# Patient Record
Sex: Male | Born: 1945
Health system: Southern US, Community
[De-identification: ages and names within clinical notes are randomized; demographics above are authoritative.]

## PROBLEM LIST (undated history)

## (undated) DIAGNOSIS — K219 Gastro-esophageal reflux disease without esophagitis: Secondary | ICD-10-CM

## (undated) DIAGNOSIS — M1990 Unspecified osteoarthritis, unspecified site: Secondary | ICD-10-CM

## (undated) DIAGNOSIS — I219 Acute myocardial infarction, unspecified: Secondary | ICD-10-CM

## (undated) DIAGNOSIS — G8929 Other chronic pain: Secondary | ICD-10-CM

## (undated) DIAGNOSIS — E119 Type 2 diabetes mellitus without complications: Secondary | ICD-10-CM

## (undated) DIAGNOSIS — R0600 Dyspnea, unspecified: Secondary | ICD-10-CM

## (undated) DIAGNOSIS — M549 Dorsalgia, unspecified: Secondary | ICD-10-CM

## (undated) DIAGNOSIS — J449 Chronic obstructive pulmonary disease, unspecified: Secondary | ICD-10-CM

## (undated) DIAGNOSIS — I8290 Acute embolism and thrombosis of unspecified vein: Secondary | ICD-10-CM

## (undated) DIAGNOSIS — I10 Essential (primary) hypertension: Secondary | ICD-10-CM

## (undated) DIAGNOSIS — E785 Hyperlipidemia, unspecified: Secondary | ICD-10-CM

## (undated) DIAGNOSIS — I493 Ventricular premature depolarization: Secondary | ICD-10-CM

## (undated) DIAGNOSIS — I499 Cardiac arrhythmia, unspecified: Secondary | ICD-10-CM

## (undated) DIAGNOSIS — G629 Polyneuropathy, unspecified: Secondary | ICD-10-CM

## (undated) HISTORY — PX: THROMBECTOMY: SHX45

## (undated) HISTORY — DX: Dorsalgia, unspecified: M54.9

## (undated) HISTORY — DX: Acute myocardial infarction, unspecified: I21.9

## (undated) HISTORY — DX: Type 2 diabetes mellitus without complications: E11.9

## (undated) HISTORY — PX: CARDIAC CATHETERIZATION: SHX172

## (undated) HISTORY — DX: Hyperlipidemia, unspecified: E78.5

## (undated) HISTORY — PX: OTHER SURGICAL HISTORY: SHX169

## (undated) HISTORY — DX: Essential (primary) hypertension: I10

## (undated) HISTORY — DX: Polyneuropathy, unspecified: G62.9

## (undated) HISTORY — DX: Other chronic pain: G89.29

## (undated) HISTORY — PX: APPENDECTOMY: SHX54

---

## 1995-02-25 HISTORY — PX: BACK SURGERY: SHX140

## 1997-02-24 HISTORY — PX: INGUINAL HERNIA REPAIR: SUR1180

## 2006-10-27 ENCOUNTER — Other Ambulatory Visit: Payer: Self-pay

## 2006-10-27 ENCOUNTER — Emergency Department: Payer: Self-pay | Admitting: Emergency Medicine

## 2007-10-25 ENCOUNTER — Ambulatory Visit: Payer: Self-pay | Admitting: Pain Medicine

## 2007-12-06 ENCOUNTER — Ambulatory Visit: Payer: Self-pay | Admitting: Pain Medicine

## 2008-01-31 ENCOUNTER — Ambulatory Visit: Payer: Self-pay | Admitting: General Surgery

## 2008-02-08 ENCOUNTER — Ambulatory Visit: Payer: Self-pay | Admitting: General Surgery

## 2008-02-08 HISTORY — PX: HERNIA REPAIR: SHX51

## 2008-03-20 ENCOUNTER — Ambulatory Visit: Payer: Self-pay

## 2008-06-05 ENCOUNTER — Ambulatory Visit: Payer: Self-pay | Admitting: Pain Medicine

## 2008-08-31 ENCOUNTER — Ambulatory Visit: Payer: Self-pay | Admitting: Physician Assistant

## 2008-12-05 ENCOUNTER — Ambulatory Visit: Payer: Self-pay | Admitting: Physician Assistant

## 2009-02-26 ENCOUNTER — Ambulatory Visit: Payer: Self-pay | Admitting: Urology

## 2009-03-09 ENCOUNTER — Ambulatory Visit: Payer: Self-pay | Admitting: Physician Assistant

## 2009-06-04 ENCOUNTER — Ambulatory Visit: Payer: Self-pay | Admitting: Pain Medicine

## 2009-06-22 ENCOUNTER — Ambulatory Visit: Payer: Self-pay | Admitting: Physician Assistant

## 2011-06-02 ENCOUNTER — Ambulatory Visit: Payer: Self-pay | Admitting: Pain Medicine

## 2011-06-16 ENCOUNTER — Ambulatory Visit: Payer: Self-pay | Admitting: Pain Medicine

## 2011-06-26 ENCOUNTER — Ambulatory Visit: Payer: Self-pay | Admitting: Pain Medicine

## 2011-06-27 ENCOUNTER — Ambulatory Visit: Payer: Self-pay | Admitting: Pain Medicine

## 2011-07-15 ENCOUNTER — Ambulatory Visit: Payer: Self-pay | Admitting: Pain Medicine

## 2011-07-16 ENCOUNTER — Ambulatory Visit: Payer: Self-pay | Admitting: Pain Medicine

## 2011-07-24 ENCOUNTER — Ambulatory Visit: Payer: Self-pay | Admitting: Pain Medicine

## 2013-11-12 DIAGNOSIS — J449 Chronic obstructive pulmonary disease, unspecified: Secondary | ICD-10-CM | POA: Insufficient documentation

## 2013-11-12 DIAGNOSIS — I10 Essential (primary) hypertension: Secondary | ICD-10-CM | POA: Insufficient documentation

## 2013-11-12 DIAGNOSIS — E119 Type 2 diabetes mellitus without complications: Secondary | ICD-10-CM | POA: Insufficient documentation

## 2013-11-12 DIAGNOSIS — I2510 Atherosclerotic heart disease of native coronary artery without angina pectoris: Secondary | ICD-10-CM | POA: Insufficient documentation

## 2013-12-19 ENCOUNTER — Ambulatory Visit: Payer: Self-pay | Admitting: Gastroenterology

## 2014-03-28 DIAGNOSIS — I25118 Atherosclerotic heart disease of native coronary artery with other forms of angina pectoris: Secondary | ICD-10-CM | POA: Diagnosis not present

## 2014-03-28 DIAGNOSIS — E119 Type 2 diabetes mellitus without complications: Secondary | ICD-10-CM | POA: Diagnosis not present

## 2014-04-04 DIAGNOSIS — E119 Type 2 diabetes mellitus without complications: Secondary | ICD-10-CM | POA: Diagnosis not present

## 2014-04-04 DIAGNOSIS — H9313 Tinnitus, bilateral: Secondary | ICD-10-CM | POA: Diagnosis not present

## 2014-04-04 DIAGNOSIS — J449 Chronic obstructive pulmonary disease, unspecified: Secondary | ICD-10-CM | POA: Diagnosis not present

## 2014-04-04 DIAGNOSIS — I25118 Atherosclerotic heart disease of native coronary artery with other forms of angina pectoris: Secondary | ICD-10-CM | POA: Diagnosis not present

## 2014-04-04 DIAGNOSIS — M5431 Sciatica, right side: Secondary | ICD-10-CM | POA: Insufficient documentation

## 2014-06-01 DIAGNOSIS — H524 Presbyopia: Secondary | ICD-10-CM | POA: Diagnosis not present

## 2014-06-01 DIAGNOSIS — H2513 Age-related nuclear cataract, bilateral: Secondary | ICD-10-CM | POA: Diagnosis not present

## 2014-06-01 DIAGNOSIS — E119 Type 2 diabetes mellitus without complications: Secondary | ICD-10-CM | POA: Diagnosis not present

## 2014-07-26 DIAGNOSIS — I25118 Atherosclerotic heart disease of native coronary artery with other forms of angina pectoris: Secondary | ICD-10-CM | POA: Diagnosis not present

## 2014-07-26 DIAGNOSIS — I10 Essential (primary) hypertension: Secondary | ICD-10-CM | POA: Diagnosis not present

## 2014-07-26 DIAGNOSIS — E119 Type 2 diabetes mellitus without complications: Secondary | ICD-10-CM | POA: Diagnosis not present

## 2014-08-01 DIAGNOSIS — I10 Essential (primary) hypertension: Secondary | ICD-10-CM | POA: Diagnosis not present

## 2014-08-01 DIAGNOSIS — J449 Chronic obstructive pulmonary disease, unspecified: Secondary | ICD-10-CM | POA: Diagnosis not present

## 2014-08-01 DIAGNOSIS — I25118 Atherosclerotic heart disease of native coronary artery with other forms of angina pectoris: Secondary | ICD-10-CM | POA: Diagnosis not present

## 2014-08-01 DIAGNOSIS — E119 Type 2 diabetes mellitus without complications: Secondary | ICD-10-CM | POA: Diagnosis not present

## 2014-10-10 DIAGNOSIS — I214 Non-ST elevation (NSTEMI) myocardial infarction: Secondary | ICD-10-CM | POA: Diagnosis not present

## 2014-10-10 DIAGNOSIS — R55 Syncope and collapse: Secondary | ICD-10-CM | POA: Insufficient documentation

## 2014-10-10 DIAGNOSIS — R079 Chest pain, unspecified: Secondary | ICD-10-CM | POA: Diagnosis not present

## 2014-10-10 DIAGNOSIS — I10 Essential (primary) hypertension: Secondary | ICD-10-CM | POA: Diagnosis not present

## 2014-10-10 DIAGNOSIS — R0602 Shortness of breath: Secondary | ICD-10-CM | POA: Diagnosis not present

## 2014-10-10 DIAGNOSIS — I25118 Atherosclerotic heart disease of native coronary artery with other forms of angina pectoris: Secondary | ICD-10-CM | POA: Diagnosis not present

## 2014-10-13 DIAGNOSIS — I493 Ventricular premature depolarization: Secondary | ICD-10-CM | POA: Diagnosis not present

## 2014-10-20 DIAGNOSIS — R079 Chest pain, unspecified: Secondary | ICD-10-CM | POA: Diagnosis not present

## 2014-10-20 DIAGNOSIS — R0602 Shortness of breath: Secondary | ICD-10-CM | POA: Diagnosis not present

## 2014-10-26 DIAGNOSIS — E119 Type 2 diabetes mellitus without complications: Secondary | ICD-10-CM | POA: Diagnosis not present

## 2014-10-26 DIAGNOSIS — I25118 Atherosclerotic heart disease of native coronary artery with other forms of angina pectoris: Secondary | ICD-10-CM | POA: Diagnosis not present

## 2014-10-27 DIAGNOSIS — I25118 Atherosclerotic heart disease of native coronary artery with other forms of angina pectoris: Secondary | ICD-10-CM | POA: Diagnosis not present

## 2014-10-27 DIAGNOSIS — R55 Syncope and collapse: Secondary | ICD-10-CM | POA: Diagnosis not present

## 2014-10-27 DIAGNOSIS — I10 Essential (primary) hypertension: Secondary | ICD-10-CM | POA: Diagnosis not present

## 2014-10-27 DIAGNOSIS — I214 Non-ST elevation (NSTEMI) myocardial infarction: Secondary | ICD-10-CM | POA: Diagnosis not present

## 2014-11-02 DIAGNOSIS — J449 Chronic obstructive pulmonary disease, unspecified: Secondary | ICD-10-CM | POA: Diagnosis not present

## 2014-11-02 DIAGNOSIS — R51 Headache: Secondary | ICD-10-CM | POA: Diagnosis not present

## 2014-11-02 DIAGNOSIS — I10 Essential (primary) hypertension: Secondary | ICD-10-CM | POA: Diagnosis not present

## 2014-11-02 DIAGNOSIS — I25118 Atherosclerotic heart disease of native coronary artery with other forms of angina pectoris: Secondary | ICD-10-CM | POA: Diagnosis not present

## 2014-11-02 DIAGNOSIS — E119 Type 2 diabetes mellitus without complications: Secondary | ICD-10-CM | POA: Diagnosis not present

## 2014-11-27 DIAGNOSIS — Z23 Encounter for immunization: Secondary | ICD-10-CM | POA: Diagnosis not present

## 2014-11-27 DIAGNOSIS — J301 Allergic rhinitis due to pollen: Secondary | ICD-10-CM | POA: Diagnosis not present

## 2015-01-26 DIAGNOSIS — I25118 Atherosclerotic heart disease of native coronary artery with other forms of angina pectoris: Secondary | ICD-10-CM | POA: Diagnosis not present

## 2015-01-26 DIAGNOSIS — R079 Chest pain, unspecified: Secondary | ICD-10-CM | POA: Diagnosis not present

## 2015-01-26 DIAGNOSIS — I10 Essential (primary) hypertension: Secondary | ICD-10-CM | POA: Diagnosis not present

## 2015-01-26 DIAGNOSIS — J449 Chronic obstructive pulmonary disease, unspecified: Secondary | ICD-10-CM | POA: Diagnosis not present

## 2015-01-26 DIAGNOSIS — R0602 Shortness of breath: Secondary | ICD-10-CM | POA: Diagnosis not present

## 2015-01-26 DIAGNOSIS — R55 Syncope and collapse: Secondary | ICD-10-CM | POA: Diagnosis not present

## 2015-01-26 DIAGNOSIS — E119 Type 2 diabetes mellitus without complications: Secondary | ICD-10-CM | POA: Diagnosis not present

## 2015-01-26 DIAGNOSIS — I214 Non-ST elevation (NSTEMI) myocardial infarction: Secondary | ICD-10-CM | POA: Diagnosis not present

## 2015-02-01 DIAGNOSIS — I25118 Atherosclerotic heart disease of native coronary artery with other forms of angina pectoris: Secondary | ICD-10-CM | POA: Diagnosis not present

## 2015-02-01 DIAGNOSIS — J449 Chronic obstructive pulmonary disease, unspecified: Secondary | ICD-10-CM | POA: Diagnosis not present

## 2015-02-01 DIAGNOSIS — I10 Essential (primary) hypertension: Secondary | ICD-10-CM | POA: Diagnosis not present

## 2015-02-01 DIAGNOSIS — E119 Type 2 diabetes mellitus without complications: Secondary | ICD-10-CM | POA: Diagnosis not present

## 2015-02-25 HISTORY — PX: COLONOSCOPY: SHX174

## 2015-05-31 DIAGNOSIS — E119 Type 2 diabetes mellitus without complications: Secondary | ICD-10-CM | POA: Diagnosis not present

## 2015-05-31 DIAGNOSIS — I25118 Atherosclerotic heart disease of native coronary artery with other forms of angina pectoris: Secondary | ICD-10-CM | POA: Diagnosis not present

## 2015-06-07 DIAGNOSIS — E119 Type 2 diabetes mellitus without complications: Secondary | ICD-10-CM | POA: Diagnosis not present

## 2015-06-07 DIAGNOSIS — Z0000 Encounter for general adult medical examination without abnormal findings: Secondary | ICD-10-CM | POA: Insufficient documentation

## 2015-06-07 DIAGNOSIS — I25118 Atherosclerotic heart disease of native coronary artery with other forms of angina pectoris: Secondary | ICD-10-CM | POA: Diagnosis not present

## 2015-06-07 DIAGNOSIS — Z0001 Encounter for general adult medical examination with abnormal findings: Secondary | ICD-10-CM | POA: Diagnosis not present

## 2015-06-07 DIAGNOSIS — I10 Essential (primary) hypertension: Secondary | ICD-10-CM | POA: Diagnosis not present

## 2015-06-07 DIAGNOSIS — J449 Chronic obstructive pulmonary disease, unspecified: Secondary | ICD-10-CM | POA: Diagnosis not present

## 2015-08-02 DIAGNOSIS — I10 Essential (primary) hypertension: Secondary | ICD-10-CM | POA: Diagnosis not present

## 2015-08-02 DIAGNOSIS — R079 Chest pain, unspecified: Secondary | ICD-10-CM | POA: Diagnosis not present

## 2015-08-02 DIAGNOSIS — R55 Syncope and collapse: Secondary | ICD-10-CM | POA: Diagnosis not present

## 2015-08-02 DIAGNOSIS — J449 Chronic obstructive pulmonary disease, unspecified: Secondary | ICD-10-CM | POA: Diagnosis not present

## 2015-08-02 DIAGNOSIS — R0602 Shortness of breath: Secondary | ICD-10-CM | POA: Diagnosis not present

## 2015-08-02 DIAGNOSIS — I2510 Atherosclerotic heart disease of native coronary artery without angina pectoris: Secondary | ICD-10-CM | POA: Diagnosis not present

## 2015-10-01 DIAGNOSIS — I25118 Atherosclerotic heart disease of native coronary artery with other forms of angina pectoris: Secondary | ICD-10-CM | POA: Diagnosis not present

## 2015-10-01 DIAGNOSIS — E119 Type 2 diabetes mellitus without complications: Secondary | ICD-10-CM | POA: Diagnosis not present

## 2015-10-08 DIAGNOSIS — I10 Essential (primary) hypertension: Secondary | ICD-10-CM | POA: Diagnosis not present

## 2015-10-08 DIAGNOSIS — J449 Chronic obstructive pulmonary disease, unspecified: Secondary | ICD-10-CM | POA: Diagnosis not present

## 2015-10-08 DIAGNOSIS — E119 Type 2 diabetes mellitus without complications: Secondary | ICD-10-CM | POA: Diagnosis not present

## 2015-10-08 DIAGNOSIS — I2510 Atherosclerotic heart disease of native coronary artery without angina pectoris: Secondary | ICD-10-CM | POA: Diagnosis not present

## 2016-01-28 DIAGNOSIS — I2510 Atherosclerotic heart disease of native coronary artery without angina pectoris: Secondary | ICD-10-CM | POA: Diagnosis not present

## 2016-01-28 DIAGNOSIS — E119 Type 2 diabetes mellitus without complications: Secondary | ICD-10-CM | POA: Diagnosis not present

## 2016-01-29 ENCOUNTER — Encounter: Payer: Self-pay | Admitting: *Deleted

## 2016-02-04 DIAGNOSIS — E119 Type 2 diabetes mellitus without complications: Secondary | ICD-10-CM | POA: Diagnosis not present

## 2016-02-04 DIAGNOSIS — I2510 Atherosclerotic heart disease of native coronary artery without angina pectoris: Secondary | ICD-10-CM | POA: Diagnosis not present

## 2016-02-04 DIAGNOSIS — Z0000 Encounter for general adult medical examination without abnormal findings: Secondary | ICD-10-CM | POA: Diagnosis not present

## 2016-02-04 DIAGNOSIS — J449 Chronic obstructive pulmonary disease, unspecified: Secondary | ICD-10-CM | POA: Diagnosis not present

## 2016-02-04 DIAGNOSIS — I10 Essential (primary) hypertension: Secondary | ICD-10-CM | POA: Diagnosis not present

## 2016-02-28 ENCOUNTER — Encounter: Payer: Self-pay | Admitting: General Surgery

## 2016-02-28 ENCOUNTER — Ambulatory Visit (INDEPENDENT_AMBULATORY_CARE_PROVIDER_SITE_OTHER): Payer: Commercial Managed Care - HMO | Admitting: General Surgery

## 2016-02-28 VITALS — BP 128/66 | HR 66 | Resp 12 | Ht 66.0 in | Wt 155.0 lb

## 2016-02-28 DIAGNOSIS — R1031 Right lower quadrant pain: Secondary | ICD-10-CM

## 2016-02-28 NOTE — Progress Notes (Signed)
Patient ID: Gregory Mendoza, male   DOB: 10/18/1945, 71 y.o.   MRN: [id number]  Chief Complaint  Patient presents with  . Hernia    HPI Gregory Mendoza is a 71 y.o. male.  Patient here today for an evaluation of a hernia.  He states that he has noticed it for about 3-4  months. No known history of undue physical activity or acute onset of the discomfort. No clear relation to activity. It does seem to be causing some right groin discomfort. This will occasionally occur when he is at rest. No nocturnal symptoms. He has not kept him from doing activities around the house. No nausea, vomiting, constipation or diarrhea.  HPI  Past Medical History:  Diagnosis Date  . Chronic back pain   . Diabetes mellitus without complication (HCC)   . Hyperlipidemia   . Hypertension   . Myocardial infarction 2007, 2009  . Peripheral neuropathy [facility])     Past Surgical History:  Procedure Laterality Date  . APPENDECTOMY  1970's  . BACK SURGERY  1997  . COLONOSCOPY  2017  . HERNIA REPAIR Left 02/08/2008   Dr Lemar Livings  . INGUINAL HERNIA REPAIR Right 1999   with mesh while in [place]    No family history on file.  Social History Social History  Substance Use Topics  . Smoking status: Former Smoker    Years: 40.00    Quit date: 02/24/1998  . Smokeless tobacco: Never Used  . Alcohol use No    No Known Allergies  Current Outpatient Prescriptions  Medication Sig Dispense Refill  . aspirin 81 MG chewable tablet Chew by mouth.    Marland Kitchen atorvastatin (LIPITOR) 10 MG tablet TAKE 1 TABLET EVERY DAY    . Blood Glucose Monitoring Suppl (GLUCOCOM BLOOD GLUCOSE MONITOR) DEVI 1 each by XX route as directed. Dx E11.9    . FIBER PO Take by mouth as needed.    . gabapentin (NEURONTIN) 600 MG tablet Take by mouth.    Marland Kitchen glimepiride (AMARYL) 4 MG tablet Take by mouth.    . isosorbide mononitrate (IMDUR) 30 MG 24 hr tablet Take 30 mg by mouth daily.    Marland Kitchen lisinopril (PRINIVIL,ZESTRIL) 10 MG tablet TAKE 1 TABLET EVERY DAY     . metFORMIN (GLUCOPHAGE) 1000 MG tablet Take by mouth.    . metoprolol (LOPRESSOR) 50 MG tablet TAKE 1 TABLET TWICE DAILY    . pioglitazone (ACTOS) 30 MG tablet TAKE 1 TABLET ONE TIME DAILY     No current facility-administered medications for this visit.     Review of Systems Review of Systems  Constitutional: Negative.   Respiratory: Negative.   Cardiovascular: Negative.   Gastrointestinal: Negative for diarrhea, nausea and vomiting.    Blood pressure 128/66, pulse 66, resp. rate 12, height 5\' 6"  (1.676 m), weight 155 lb (70.3 kg).  Physical Exam Physical Exam  Constitutional: He is oriented to person, place, and time. He appears well-developed and well-nourished.  HENT:  Mouth/Throat: Oropharynx is clear and moist.  Eyes: Conjunctivae are normal. No scleral icterus.  Neck: Neck supple.  Cardiovascular: Normal rate, regular rhythm and normal heart sounds.   Pulmonary/Chest: Effort normal and breath sounds normal.  Abdominal: Soft. Normal appearance and bowel sounds are normal. There is tenderness. No hernia. Hernia confirmed negative in the right inguinal area and confirmed negative in the left inguinal area.  Mild tenderness right groin  Genitourinary: Testes normal.  Lymphadenopathy:    He has no cervical adenopathy.  Neurological: He is  alert and oriented to person, place, and time.  Skin: Skin is warm and dry.  Psychiatric: His behavior is normal.    Data Reviewed No laboratory for review.  Assessment    No evidence of recurrent herniation. Possible muscle strain.    Plan    My impression is that the patient he had a minor injury from lifting lash physical activity and has just continued to trigger this area with activities of daily living, I see nothing to suggest recurrent herniation.    Follow up as needed or if symptoms worsen. Proper lifting techniques reviewed. The patient is aware to call back for any questions or concerns.   This information has  been scribed by Gregory Daft RN, BSN,BC.    Earline Mayotte 03/04/2016, 10:52 AM

## 2016-02-28 NOTE — Patient Instructions (Addendum)
The patient is aware to call back for any questions or concerns. Follow up as needed or if symptoms worsen. Proper lifting techniques reviewed

## 2016-03-04 DIAGNOSIS — R1031 Right lower quadrant pain: Secondary | ICD-10-CM | POA: Insufficient documentation

## 2016-03-06 ENCOUNTER — Encounter: Payer: Self-pay | Admitting: General Surgery

## 2016-06-04 DIAGNOSIS — I2510 Atherosclerotic heart disease of native coronary artery without angina pectoris: Secondary | ICD-10-CM | POA: Diagnosis not present

## 2016-06-04 DIAGNOSIS — I10 Essential (primary) hypertension: Secondary | ICD-10-CM | POA: Diagnosis not present

## 2016-06-04 DIAGNOSIS — E119 Type 2 diabetes mellitus without complications: Secondary | ICD-10-CM | POA: Diagnosis not present

## 2016-06-11 DIAGNOSIS — J449 Chronic obstructive pulmonary disease, unspecified: Secondary | ICD-10-CM | POA: Diagnosis not present

## 2016-06-11 DIAGNOSIS — I10 Essential (primary) hypertension: Secondary | ICD-10-CM | POA: Diagnosis not present

## 2016-06-11 DIAGNOSIS — I2510 Atherosclerotic heart disease of native coronary artery without angina pectoris: Secondary | ICD-10-CM | POA: Diagnosis not present

## 2016-06-11 DIAGNOSIS — Z0000 Encounter for general adult medical examination without abnormal findings: Secondary | ICD-10-CM | POA: Diagnosis not present

## 2016-06-11 DIAGNOSIS — E119 Type 2 diabetes mellitus without complications: Secondary | ICD-10-CM | POA: Diagnosis not present

## 2016-06-11 DIAGNOSIS — M546 Pain in thoracic spine: Secondary | ICD-10-CM | POA: Diagnosis not present

## 2016-06-11 DIAGNOSIS — R1013 Epigastric pain: Secondary | ICD-10-CM | POA: Diagnosis not present

## 2016-06-12 ENCOUNTER — Other Ambulatory Visit: Payer: Self-pay | Admitting: Internal Medicine

## 2016-06-12 DIAGNOSIS — R1013 Epigastric pain: Secondary | ICD-10-CM

## 2016-06-12 DIAGNOSIS — M546 Pain in thoracic spine: Secondary | ICD-10-CM

## 2016-06-17 ENCOUNTER — Ambulatory Visit: Payer: Medicare HMO

## 2016-06-26 DIAGNOSIS — I2510 Atherosclerotic heart disease of native coronary artery without angina pectoris: Secondary | ICD-10-CM | POA: Diagnosis not present

## 2016-06-26 DIAGNOSIS — E119 Type 2 diabetes mellitus without complications: Secondary | ICD-10-CM | POA: Diagnosis not present

## 2016-06-26 DIAGNOSIS — J449 Chronic obstructive pulmonary disease, unspecified: Secondary | ICD-10-CM | POA: Diagnosis not present

## 2016-06-26 DIAGNOSIS — Z794 Long term (current) use of insulin: Secondary | ICD-10-CM | POA: Diagnosis not present

## 2016-06-26 DIAGNOSIS — R110 Nausea: Secondary | ICD-10-CM | POA: Diagnosis not present

## 2016-10-06 DIAGNOSIS — E119 Type 2 diabetes mellitus without complications: Secondary | ICD-10-CM | POA: Diagnosis not present

## 2016-10-06 DIAGNOSIS — I2510 Atherosclerotic heart disease of native coronary artery without angina pectoris: Secondary | ICD-10-CM | POA: Diagnosis not present

## 2016-10-13 DIAGNOSIS — I10 Essential (primary) hypertension: Secondary | ICD-10-CM | POA: Diagnosis not present

## 2016-10-13 DIAGNOSIS — E119 Type 2 diabetes mellitus without complications: Secondary | ICD-10-CM | POA: Diagnosis not present

## 2016-10-13 DIAGNOSIS — I2510 Atherosclerotic heart disease of native coronary artery without angina pectoris: Secondary | ICD-10-CM | POA: Diagnosis not present

## 2016-10-13 DIAGNOSIS — J449 Chronic obstructive pulmonary disease, unspecified: Secondary | ICD-10-CM | POA: Diagnosis not present

## 2016-10-13 DIAGNOSIS — Z794 Long term (current) use of insulin: Secondary | ICD-10-CM | POA: Diagnosis not present

## 2016-10-17 ENCOUNTER — Encounter: Payer: Self-pay | Admitting: Emergency Medicine

## 2016-10-17 ENCOUNTER — Emergency Department
Admission: EM | Admit: 2016-10-17 | Discharge: 2016-10-17 | Disposition: A | Discharge status: Home / Self Care | Payer: Medicare HMO | Attending: Emergency Medicine | Admitting: Emergency Medicine

## 2016-10-17 ENCOUNTER — Emergency Department: Payer: Medicare HMO

## 2016-10-17 DIAGNOSIS — Z7982 Long term (current) use of aspirin: Secondary | ICD-10-CM | POA: Diagnosis not present

## 2016-10-17 DIAGNOSIS — S8251XA Displaced fracture of medial malleolus of right tibia, initial encounter for closed fracture: Secondary | ICD-10-CM

## 2016-10-17 DIAGNOSIS — Y92017 Garden or yard in single-family (private) house as the place of occurrence of the external cause: Secondary | ICD-10-CM | POA: Diagnosis not present

## 2016-10-17 DIAGNOSIS — Y93H2 Activity, gardening and landscaping: Secondary | ICD-10-CM | POA: Insufficient documentation

## 2016-10-17 DIAGNOSIS — W11XXXA Fall on and from ladder, initial encounter: Secondary | ICD-10-CM | POA: Diagnosis not present

## 2016-10-17 DIAGNOSIS — Y998 Other external cause status: Secondary | ICD-10-CM | POA: Insufficient documentation

## 2016-10-17 DIAGNOSIS — I252 Old myocardial infarction: Secondary | ICD-10-CM | POA: Insufficient documentation

## 2016-10-17 DIAGNOSIS — S99911A Unspecified injury of right ankle, initial encounter: Secondary | ICD-10-CM | POA: Diagnosis not present

## 2016-10-17 DIAGNOSIS — M549 Dorsalgia, unspecified: Secondary | ICD-10-CM | POA: Insufficient documentation

## 2016-10-17 DIAGNOSIS — I10 Essential (primary) hypertension: Secondary | ICD-10-CM | POA: Diagnosis not present

## 2016-10-17 DIAGNOSIS — S99921A Unspecified injury of right foot, initial encounter: Secondary | ICD-10-CM | POA: Diagnosis not present

## 2016-10-17 DIAGNOSIS — G8929 Other chronic pain: Secondary | ICD-10-CM | POA: Diagnosis not present

## 2016-10-17 DIAGNOSIS — E1140 Type 2 diabetes mellitus with diabetic neuropathy, unspecified: Secondary | ICD-10-CM | POA: Insufficient documentation

## 2016-10-17 DIAGNOSIS — Z7984 Long term (current) use of oral hypoglycemic drugs: Secondary | ICD-10-CM | POA: Insufficient documentation

## 2016-10-17 DIAGNOSIS — M25571 Pain in right ankle and joints of right foot: Secondary | ICD-10-CM | POA: Diagnosis not present

## 2016-10-17 DIAGNOSIS — Z79899 Other long term (current) drug therapy: Secondary | ICD-10-CM | POA: Diagnosis not present

## 2016-10-17 DIAGNOSIS — Z87891 Personal history of nicotine dependence: Secondary | ICD-10-CM | POA: Diagnosis not present

## 2016-10-17 DIAGNOSIS — S8991XA Unspecified injury of right lower leg, initial encounter: Secondary | ICD-10-CM | POA: Diagnosis present

## 2016-10-17 MED ORDER — TRAMADOL HCL 50 MG PO TABS
50.0000 mg | ORAL_TABLET | Freq: Once | ORAL | Status: AC
  Administered 2016-10-17: 50 mg via ORAL
  Filled 2016-10-17: qty 1

## 2016-10-17 MED ORDER — TRAMADOL HCL 50 MG PO TABS: 50.0000 mg | ORAL_TABLET | Freq: Four times a day (QID) | ORAL | 0 refills | Status: DC | PRN

## 2016-10-17 NOTE — Discharge Instructions (Signed)
Follow up with podiatry. You may also take tylenol in addition or instead of the prescription medication written for you today. Return to the ER for symptoms that change or worsen or for new concerns if unable to schedule an appointment.

## 2016-10-17 NOTE — ED Notes (Signed)

## 2016-10-17 NOTE — ED Triage Notes (Signed)
Fall from ladder about 7-8 feel.  Landed on back and right foot hit bottom wrung.  No LOC.  C/O right foot pain

## 2016-10-17 NOTE — ED Provider Notes (Signed)
[facility] Emergency Department Provider Note ____________________________________________  Time seen: Approximately 6:43 PM  I have reviewed the triage vital signs and the nursing notes.   HISTORY  Chief Complaint Fall    HPI Gregory Mendoza is a 71 y.o. male who presents to the emergency department for evaluation after falling from a ladder. He states that he was trimming a tree while on a ladder and lost his balance. His right foot hit the bottom wrung. He states he landed on his butt and back, but has chronic back pain and doesn't feel any difference since the fall.He denies loss of consciousness or striking his head.    Past Medical History:  Diagnosis Date  . Chronic back pain   . Diabetes mellitus without complication (HCC)   . Hyperlipidemia   . Hypertension   . Myocardial infarction [facility]) 2007, 2009  . Peripheral neuropathy     Patient Active Problem List   Diagnosis Date Noted  . Groin pain, right 03/04/2016    Past Surgical History:  Procedure Laterality Date  . APPENDECTOMY  1970's  . BACK SURGERY  1997  . COLONOSCOPY  2017  . HERNIA REPAIR Left 02/08/2008   Dr Lemar Livings  . INGUINAL HERNIA REPAIR Right 1999   with mesh while in [place]    Prior to Admission medications   Medication Sig Start Date End Date Taking? Authorizing Provider  aspirin 81 MG chewable tablet Chew by mouth.    [provider]  atorvastatin (LIPITOR) 10 MG tablet TAKE 1 TABLET EVERY DAY 04/16/15   [provider]  Blood Glucose Monitoring Suppl (GLUCOCOM BLOOD GLUCOSE MONITOR) DEVI 1 each by XX route as directed. Dx E11.9 03/02/14   [provider]  FIBER PO Take by mouth as needed.    [provider]  gabapentin (NEURONTIN) 600 MG tablet Take by mouth. 06/07/15   [provider]  glimepiride (AMARYL) 4 MG tablet Take by mouth. 06/07/15 06/06/16  [provider]  isosorbide mononitrate (IMDUR) 30 MG 24 hr tablet Take  30 mg by mouth daily.    [provider]  lisinopril (PRINIVIL,ZESTRIL) 10 MG tablet TAKE 1 TABLET EVERY DAY 01/11/16   [provider]  metFORMIN (GLUCOPHAGE) 1000 MG tablet Take by mouth. 06/07/15   [provider]  metoprolol (LOPRESSOR) 50 MG tablet TAKE 1 TABLET TWICE DAILY 12/10/15   [provider]  pioglitazone (ACTOS) 30 MG tablet TAKE 1 TABLET ONE TIME DAILY 12/11/15   [provider]  traMADol (ULTRAM) 50 MG tablet Take 1 tablet (50 mg total) by mouth every 6 (six) hours as needed. 10/17/16   Chinita Pester, FNP    Allergies Patient has no known allergies.  No family history on file.  Social History Social History  Substance Use Topics  . Smoking status: Former Smoker    Years: 40.00    Quit date: 02/24/1998  . Smokeless tobacco: Never Used  . Alcohol use No    Review of Systems Constitutional: Negative for recent illness.  Cardiovascular: Negative for chest pain Respiratory: Negative for shortness of breath. Musculoskeletal: Positive for right foot and ankle pain. Skin: Negative for wound  Neurological: negative for loss of consciousness.  ____________________________________________   PHYSICAL EXAM:  VITAL SIGNS: ED Triage Vitals  Enc Vitals Group     BP 10/17/16 1756 (!) 155/80     Pulse Rate 10/17/16 1756 (!) 104     Resp 10/17/16 1756 16     Temp 10/17/16  1756 98.2 F (36.8 C)     Temp Source 10/17/16 1756 Oral     SpO2 10/17/16 1756 97 %     Weight 10/17/16 1756 154 lb (69.9 kg)     Height 10/17/16 1756 5\' 6"  (1.676 m)     Head Circumference --      Peak Flow --      Pain Score 10/17/16 1755 8     Pain Loc --      Pain Edu? --      Excl. in GC? --     Constitutional: Alert and oriented. Well appearing and in no acute distress. Eyes: Conjunctivae are clear without discharge or drainage.  Head: Atraumatic. Neck: Nexus Criteria is negative  Respiratory: Breath sounds clear to  auscultation. Musculoskeletal: Tenderness to palpation over the medial malleolus and the lateral malleolus with ATFL patter tenderness and swelling. Neurologic: Awake, alert, and oriented.  Skin: Atraumatic.  Psychiatric: Affect and behavior are appropriate.  ____________________________________________   LABS (all labs ordered are listed, but only abnormal results are displayed)  Labs Reviewed - No data to display ____________________________________________  RADIOLOGY  Right ankle image: IMPRESSION: Acute tiny cortical avulsion injury at the medial malleolus.  Right foot negative for acute bony abnormality per radiology. ____________________________________________   PROCEDURES  Procedure(s) performed: Ankle stirrup splint applied to the right foot and ankle by ER tech. Patient neurovascularly intact post application.  ____________________________________________   INITIAL IMPRESSION / ASSESSMENT AND PLAN / ED COURSE  Gregory Mendoza is a 71 y.o. male who presents to the emergency department for treatment and evaluation of right foot and ankle pain after a mechanical, non-syncopal fall. Ankle image shows an avulsion fracture to the medial malleolus. He was treated with an ankle stirrup splint and advised to rest, ice, elevate the foot, limit weight bearing, and follow up with podiatry. He will be given a prescription for tramadol and advised to also take tylenol if needed. He was instructed to return to the ER for symptoms that change or worsen or for new concerns.  Pertinent labs & imaging results that were available during my care of the patient were reviewed by me and considered in my medical decision making (see chart for details).  _________________________________________   FINAL CLINICAL IMPRESSION(S) / ED DIAGNOSES  Final diagnoses:  Closed avulsion fracture of medial malleolus of right tibia, initial encounter    Discharge Medication List as of 10/17/2016  7:39  PM    START taking these medications   Details  traMADol (ULTRAM) 50 MG tablet Take 1 tablet (50 mg total) by mouth every 6 (six) hours as needed., Starting Fri 10/17/2016, Print        If controlled substance prescribed during this visit, 12 month history viewed on the [facility] prior to issuing an initial prescription for Schedule II or III opiod.    Chinita Pester, FNP 10/17/16 2334    Loleta Rose, MD 10/18/16 [phone]

## 2016-10-20 DIAGNOSIS — S8251XA Displaced fracture of medial malleolus of right tibia, initial encounter for closed fracture: Secondary | ICD-10-CM | POA: Diagnosis not present

## 2016-10-20 DIAGNOSIS — E119 Type 2 diabetes mellitus without complications: Secondary | ICD-10-CM | POA: Diagnosis not present

## 2017-02-12 DIAGNOSIS — M25571 Pain in right ankle and joints of right foot: Secondary | ICD-10-CM | POA: Diagnosis not present

## 2017-02-12 DIAGNOSIS — M76821 Posterior tibial tendinitis, right leg: Secondary | ICD-10-CM | POA: Diagnosis not present

## 2017-02-13 DIAGNOSIS — I2510 Atherosclerotic heart disease of native coronary artery without angina pectoris: Secondary | ICD-10-CM | POA: Diagnosis not present

## 2017-02-13 DIAGNOSIS — E119 Type 2 diabetes mellitus without complications: Secondary | ICD-10-CM | POA: Diagnosis not present

## 2017-02-13 DIAGNOSIS — I10 Essential (primary) hypertension: Secondary | ICD-10-CM | POA: Diagnosis not present

## 2017-02-13 DIAGNOSIS — Z794 Long term (current) use of insulin: Secondary | ICD-10-CM | POA: Diagnosis not present

## 2017-02-20 DIAGNOSIS — Z794 Long term (current) use of insulin: Secondary | ICD-10-CM | POA: Diagnosis not present

## 2017-02-20 DIAGNOSIS — J449 Chronic obstructive pulmonary disease, unspecified: Secondary | ICD-10-CM | POA: Diagnosis not present

## 2017-02-20 DIAGNOSIS — I2510 Atherosclerotic heart disease of native coronary artery without angina pectoris: Secondary | ICD-10-CM | POA: Diagnosis not present

## 2017-02-20 DIAGNOSIS — I10 Essential (primary) hypertension: Secondary | ICD-10-CM | POA: Diagnosis not present

## 2017-02-20 DIAGNOSIS — E119 Type 2 diabetes mellitus without complications: Secondary | ICD-10-CM | POA: Diagnosis not present

## 2017-04-07 DIAGNOSIS — S8254XD Nondisplaced fracture of medial malleolus of right tibia, subsequent encounter for closed fracture with routine healing: Secondary | ICD-10-CM | POA: Diagnosis not present

## 2017-04-10 ENCOUNTER — Other Ambulatory Visit: Payer: Self-pay | Admitting: Orthopedic Surgery

## 2017-04-10 DIAGNOSIS — S8254XD Nondisplaced fracture of medial malleolus of right tibia, subsequent encounter for closed fracture with routine healing: Secondary | ICD-10-CM

## 2017-04-20 ENCOUNTER — Ambulatory Visit
Admission: RE | Admit: 2017-04-20 | Discharge: 2017-04-20 | Disposition: A | Discharge status: Home / Self Care | Payer: Medicare HMO | Source: Ambulatory Visit | Attending: Orthopedic Surgery | Admitting: Orthopedic Surgery

## 2017-04-20 DIAGNOSIS — S8254XD Nondisplaced fracture of medial malleolus of right tibia, subsequent encounter for closed fracture with routine healing: Secondary | ICD-10-CM | POA: Insufficient documentation

## 2017-04-20 DIAGNOSIS — X58XXXD Exposure to other specified factors, subsequent encounter: Secondary | ICD-10-CM | POA: Insufficient documentation

## 2017-04-20 DIAGNOSIS — R600 Localized edema: Secondary | ICD-10-CM | POA: Diagnosis not present

## 2017-04-23 DIAGNOSIS — E1142 Type 2 diabetes mellitus with diabetic polyneuropathy: Secondary | ICD-10-CM | POA: Diagnosis not present

## 2017-04-23 DIAGNOSIS — S93491S Sprain of other ligament of right ankle, sequela: Secondary | ICD-10-CM | POA: Diagnosis not present

## 2017-04-23 DIAGNOSIS — S86311S Strain of muscle(s) and tendon(s) of peroneal muscle group at lower leg level, right leg, sequela: Secondary | ICD-10-CM | POA: Diagnosis not present

## 2017-04-23 DIAGNOSIS — S93421S Sprain of deltoid ligament of right ankle, sequela: Secondary | ICD-10-CM | POA: Diagnosis not present

## 2017-04-23 DIAGNOSIS — M25471 Effusion, right ankle: Secondary | ICD-10-CM | POA: Diagnosis not present

## 2017-04-23 DIAGNOSIS — M7671 Peroneal tendinitis, right leg: Secondary | ICD-10-CM | POA: Diagnosis not present

## 2017-04-23 DIAGNOSIS — M65879 Other synovitis and tenosynovitis, unspecified ankle and foot: Secondary | ICD-10-CM | POA: Diagnosis not present

## 2017-04-23 DIAGNOSIS — M25571 Pain in right ankle and joints of right foot: Secondary | ICD-10-CM | POA: Diagnosis not present

## 2017-05-21 DIAGNOSIS — E119 Type 2 diabetes mellitus without complications: Secondary | ICD-10-CM | POA: Diagnosis not present

## 2017-05-21 DIAGNOSIS — I2510 Atherosclerotic heart disease of native coronary artery without angina pectoris: Secondary | ICD-10-CM | POA: Diagnosis not present

## 2017-05-21 DIAGNOSIS — I10 Essential (primary) hypertension: Secondary | ICD-10-CM | POA: Diagnosis not present

## 2017-05-21 DIAGNOSIS — Z794 Long term (current) use of insulin: Secondary | ICD-10-CM | POA: Diagnosis not present

## 2017-05-21 DIAGNOSIS — J449 Chronic obstructive pulmonary disease, unspecified: Secondary | ICD-10-CM | POA: Diagnosis not present

## 2017-05-21 DIAGNOSIS — R0602 Shortness of breath: Secondary | ICD-10-CM | POA: Diagnosis not present

## 2017-06-24 DIAGNOSIS — E119 Type 2 diabetes mellitus without complications: Secondary | ICD-10-CM | POA: Diagnosis not present

## 2017-06-24 DIAGNOSIS — I10 Essential (primary) hypertension: Secondary | ICD-10-CM | POA: Diagnosis not present

## 2017-06-24 DIAGNOSIS — I2510 Atherosclerotic heart disease of native coronary artery without angina pectoris: Secondary | ICD-10-CM | POA: Diagnosis not present

## 2017-06-24 DIAGNOSIS — Z794 Long term (current) use of insulin: Secondary | ICD-10-CM | POA: Diagnosis not present

## 2017-07-01 DIAGNOSIS — J449 Chronic obstructive pulmonary disease, unspecified: Secondary | ICD-10-CM | POA: Diagnosis not present

## 2017-07-01 DIAGNOSIS — E119 Type 2 diabetes mellitus without complications: Secondary | ICD-10-CM | POA: Diagnosis not present

## 2017-07-01 DIAGNOSIS — Z0000 Encounter for general adult medical examination without abnormal findings: Secondary | ICD-10-CM | POA: Diagnosis not present

## 2017-07-01 DIAGNOSIS — I2510 Atherosclerotic heart disease of native coronary artery without angina pectoris: Secondary | ICD-10-CM | POA: Diagnosis not present

## 2017-07-01 DIAGNOSIS — Z794 Long term (current) use of insulin: Secondary | ICD-10-CM | POA: Diagnosis not present

## 2017-07-01 DIAGNOSIS — I10 Essential (primary) hypertension: Secondary | ICD-10-CM | POA: Diagnosis not present

## 2017-10-15 DIAGNOSIS — E089 Diabetes mellitus due to underlying condition without complications: Secondary | ICD-10-CM | POA: Diagnosis not present

## 2017-10-15 DIAGNOSIS — H524 Presbyopia: Secondary | ICD-10-CM | POA: Diagnosis not present

## 2017-10-15 DIAGNOSIS — H25013 Cortical age-related cataract, bilateral: Secondary | ICD-10-CM | POA: Diagnosis not present

## 2017-10-15 DIAGNOSIS — E119 Type 2 diabetes mellitus without complications: Secondary | ICD-10-CM | POA: Diagnosis not present

## 2017-10-28 DIAGNOSIS — Z0000 Encounter for general adult medical examination without abnormal findings: Secondary | ICD-10-CM | POA: Diagnosis not present

## 2017-10-28 DIAGNOSIS — Z794 Long term (current) use of insulin: Secondary | ICD-10-CM | POA: Diagnosis not present

## 2017-10-28 DIAGNOSIS — I10 Essential (primary) hypertension: Secondary | ICD-10-CM | POA: Diagnosis not present

## 2017-10-28 DIAGNOSIS — Z125 Encounter for screening for malignant neoplasm of prostate: Secondary | ICD-10-CM | POA: Diagnosis not present

## 2017-10-28 DIAGNOSIS — I2510 Atherosclerotic heart disease of native coronary artery without angina pectoris: Secondary | ICD-10-CM | POA: Diagnosis not present

## 2017-10-28 DIAGNOSIS — E119 Type 2 diabetes mellitus without complications: Secondary | ICD-10-CM | POA: Diagnosis not present

## 2017-11-03 DIAGNOSIS — I2510 Atherosclerotic heart disease of native coronary artery without angina pectoris: Secondary | ICD-10-CM | POA: Diagnosis not present

## 2017-11-03 DIAGNOSIS — I10 Essential (primary) hypertension: Secondary | ICD-10-CM | POA: Diagnosis not present

## 2017-11-03 DIAGNOSIS — E119 Type 2 diabetes mellitus without complications: Secondary | ICD-10-CM | POA: Diagnosis not present

## 2017-11-03 DIAGNOSIS — Z794 Long term (current) use of insulin: Secondary | ICD-10-CM | POA: Diagnosis not present

## 2017-11-03 DIAGNOSIS — J449 Chronic obstructive pulmonary disease, unspecified: Secondary | ICD-10-CM | POA: Diagnosis not present

## 2017-12-12 ENCOUNTER — Other Ambulatory Visit: Payer: Self-pay

## 2017-12-12 ENCOUNTER — Emergency Department: Payer: Medicare HMO

## 2017-12-12 ENCOUNTER — Inpatient Hospital Stay
Admission: EM | Admit: 2017-12-12 | Discharge: 2017-12-15 | DRG: 247 | Disposition: A | Discharge status: Home / Self Care | Payer: Medicare HMO | Attending: Internal Medicine | Admitting: Internal Medicine

## 2017-12-12 DIAGNOSIS — I252 Old myocardial infarction: Secondary | ICD-10-CM

## 2017-12-12 DIAGNOSIS — I214 Non-ST elevation (NSTEMI) myocardial infarction: Principal | ICD-10-CM | POA: Diagnosis present

## 2017-12-12 DIAGNOSIS — R079 Chest pain, unspecified: Secondary | ICD-10-CM

## 2017-12-12 DIAGNOSIS — E785 Hyperlipidemia, unspecified: Secondary | ICD-10-CM | POA: Diagnosis not present

## 2017-12-12 DIAGNOSIS — Z955 Presence of coronary angioplasty implant and graft: Secondary | ICD-10-CM

## 2017-12-12 DIAGNOSIS — Z87891 Personal history of nicotine dependence: Secondary | ICD-10-CM | POA: Diagnosis not present

## 2017-12-12 DIAGNOSIS — I2510 Atherosclerotic heart disease of native coronary artery without angina pectoris: Secondary | ICD-10-CM | POA: Diagnosis present

## 2017-12-12 DIAGNOSIS — I10 Essential (primary) hypertension: Secondary | ICD-10-CM | POA: Diagnosis present

## 2017-12-12 DIAGNOSIS — Y831 Surgical operation with implant of artificial internal device as the cause of abnormal reaction of the patient, or of later complication, without mention of misadventure at the time of the procedure: Secondary | ICD-10-CM | POA: Diagnosis present

## 2017-12-12 DIAGNOSIS — E782 Mixed hyperlipidemia: Secondary | ICD-10-CM | POA: Diagnosis present

## 2017-12-12 DIAGNOSIS — G8929 Other chronic pain: Secondary | ICD-10-CM | POA: Diagnosis present

## 2017-12-12 DIAGNOSIS — Z79899 Other long term (current) drug therapy: Secondary | ICD-10-CM

## 2017-12-12 DIAGNOSIS — Z9889 Other specified postprocedural states: Secondary | ICD-10-CM | POA: Diagnosis not present

## 2017-12-12 DIAGNOSIS — R0789 Other chest pain: Secondary | ICD-10-CM | POA: Diagnosis not present

## 2017-12-12 DIAGNOSIS — Z7984 Long term (current) use of oral hypoglycemic drugs: Secondary | ICD-10-CM

## 2017-12-12 DIAGNOSIS — Z7982 Long term (current) use of aspirin: Secondary | ICD-10-CM

## 2017-12-12 DIAGNOSIS — Z794 Long term (current) use of insulin: Secondary | ICD-10-CM | POA: Diagnosis not present

## 2017-12-12 DIAGNOSIS — T82855A Stenosis of coronary artery stent, initial encounter: Secondary | ICD-10-CM | POA: Diagnosis present

## 2017-12-12 DIAGNOSIS — E1142 Type 2 diabetes mellitus with diabetic polyneuropathy: Secondary | ICD-10-CM | POA: Diagnosis present

## 2017-12-12 DIAGNOSIS — M545 Low back pain: Secondary | ICD-10-CM | POA: Diagnosis present

## 2017-12-12 DIAGNOSIS — E119 Type 2 diabetes mellitus without complications: Secondary | ICD-10-CM | POA: Diagnosis not present

## 2017-12-12 LAB — BASIC METABOLIC PANEL
Anion gap: 8 (ref 5–15)
BUN: 19 mg/dL (ref 8–23)
CALCIUM: 9 mg/dL (ref 8.9–10.3)
CO2: 28 mmol/L (ref 22–32)
CREATININE: 0.83 mg/dL (ref 0.61–1.24)
Chloride: 104 mmol/L (ref 98–111)
GFR calc non Af Amer: >60 mL/min (ref >60)
GLUCOSE: 237 mg/dL — AB (ref 70–99)
Potassium: 4.2 mmol/L (ref 3.5–5.1)
Sodium: 140 mmol/L (ref 135–145)

## 2017-12-12 LAB — CBC
HCT: 42.7 % (ref 39.0–52.0)
Hemoglobin: 13.9 g/dL (ref 13.0–17.0)
MCH: 30.4 pg (ref 26.0–34.0)
MCHC: 32.6 g/dL (ref 30.0–36.0)
MCV: 93.4 fL (ref 80.0–100.0)
PLATELETS: 341 10*3/uL (ref 150–400)
RBC: 4.57 MIL/uL (ref 4.22–5.81)
RDW: 13.1 % (ref 11.5–15.5)
WBC: 7.9 10*3/uL (ref 4.0–10.5)
nRBC: 0 % (ref 0.0–0.2)

## 2017-12-12 LAB — TROPONIN I
Troponin I: <0.03 ng/mL (ref <0.03)
Troponin I: 0.23 ng/mL (ref <0.03)

## 2017-12-12 LAB — GLUCOSE, CAPILLARY: Glucose-Capillary: 161 mg/dL — ABNORMAL HIGH (ref 70–99)

## 2017-12-12 MED ORDER — GLIMEPIRIDE 2 MG PO TABS
4.0000 mg | ORAL_TABLET | Freq: Every day | ORAL | Status: DC
  Administered 2017-12-13 – 2017-12-15 (×2): 4 mg via ORAL
  Filled 2017-12-12 (×4): qty 1
  Filled 2017-12-12: qty 2

## 2017-12-12 MED ORDER — GABAPENTIN 600 MG PO TABS
600.0000 mg | ORAL_TABLET | Freq: Three times a day (TID) | ORAL | Status: DC
  Administered 2017-12-13 – 2017-12-15 (×7): 600 mg via ORAL
  Filled 2017-12-12 (×7): qty 1

## 2017-12-12 MED ORDER — ASPIRIN 81 MG PO CHEW
81.0000 mg | CHEWABLE_TABLET | Freq: Every day | ORAL | Status: DC
  Administered 2017-12-13 – 2017-12-14 (×3): 81 mg via ORAL
  Filled 2017-12-12 (×4): qty 1

## 2017-12-12 MED ORDER — INSULIN NPH (HUMAN) (ISOPHANE) 100 UNIT/ML ~~LOC~~ SUSP: 15.0000 [IU] | Freq: Two times a day (BID) | SUBCUTANEOUS | Status: DC

## 2017-12-12 MED ORDER — INSULIN DETEMIR 100 UNIT/ML ~~LOC~~ SOLN
15.0000 [IU] | Freq: Two times a day (BID) | SUBCUTANEOUS | Status: DC
  Administered 2017-12-13 – 2017-12-15 (×5): 15 [IU] via SUBCUTANEOUS
  Filled 2017-12-12 (×8): qty 0.15

## 2017-12-12 MED ORDER — ACETAMINOPHEN 500 MG PO TABS: 500.0000 mg | ORAL_TABLET | Freq: Four times a day (QID) | ORAL | Status: DC | PRN

## 2017-12-12 MED ORDER — ISOSORBIDE MONONITRATE ER 30 MG PO TB24
30.0000 mg | ORAL_TABLET | Freq: Every day | ORAL | Status: DC
  Administered 2017-12-13 – 2017-12-15 (×3): 30 mg via ORAL
  Filled 2017-12-12 (×4): qty 1

## 2017-12-12 MED ORDER — ATORVASTATIN CALCIUM 10 MG PO TABS
10.0000 mg | ORAL_TABLET | Freq: Every day | ORAL | Status: DC
  Administered 2017-12-13 (×2): 10 mg via ORAL
  Filled 2017-12-12 (×3): qty 1

## 2017-12-12 MED ORDER — ONDANSETRON HCL 4 MG/2ML IJ SOLN: 4.0000 mg | Freq: Four times a day (QID) | INTRAMUSCULAR | Status: DC | PRN

## 2017-12-12 MED ORDER — ENOXAPARIN SODIUM 40 MG/0.4ML ~~LOC~~ SOLN
40.0000 mg | SUBCUTANEOUS | Status: DC
  Administered 2017-12-12: 40 mg via SUBCUTANEOUS
  Filled 2017-12-12: qty 0.4

## 2017-12-12 MED ORDER — LISINOPRIL 10 MG PO TABS
10.0000 mg | ORAL_TABLET | Freq: Every day | ORAL | Status: DC
  Administered 2017-12-13 – 2017-12-15 (×3): 10 mg via ORAL
  Filled 2017-12-12 (×4): qty 1

## 2017-12-12 MED ORDER — ADULT MULTIVITAMIN W/MINERALS CH
1.0000 | ORAL_TABLET | Freq: Every day | ORAL | Status: DC
  Administered 2017-12-13 – 2017-12-15 (×2): 1 via ORAL
  Filled 2017-12-12 (×3): qty 1

## 2017-12-12 MED ORDER — ACETAMINOPHEN 325 MG PO TABS
650.0000 mg | ORAL_TABLET | ORAL | Status: DC | PRN
  Administered 2017-12-13 (×2): 650 mg via ORAL
  Filled 2017-12-12 (×3): qty 2

## 2017-12-12 MED ORDER — PIOGLITAZONE HCL 30 MG PO TABS
30.0000 mg | ORAL_TABLET | Freq: Every day | ORAL | Status: DC
  Administered 2017-12-13 – 2017-12-15 (×2): 30 mg via ORAL
  Filled 2017-12-12 (×4): qty 1

## 2017-12-12 MED ORDER — METOPROLOL TARTRATE 50 MG PO TABS
50.0000 mg | ORAL_TABLET | Freq: Two times a day (BID) | ORAL | Status: DC
  Administered 2017-12-13 – 2017-12-15 (×5): 50 mg via ORAL
  Filled 2017-12-12 (×6): qty 1

## 2017-12-12 MED ORDER — DICLOFENAC SODIUM 1 % TD GEL
4.0000 g | Freq: Four times a day (QID) | TRANSDERMAL | Status: DC
  Filled 2017-12-12: qty 100

## 2017-12-12 NOTE — ED Notes (Signed)
Pt and family updated on delay for admission. tv remote provided.

## 2017-12-12 NOTE — ED Notes (Signed)
hospitalist in to see pt.

## 2017-12-12 NOTE — ED Provider Notes (Signed)
[facility] Emergency Department Provider Note   ____________________________________________    I have reviewed the triage vital signs and the nursing notes.   HISTORY  Chief Complaint Chest Pain     HPI Gregory Mendoza Overall is a 72 y.o. male who presents with complaints of chest pain.  Patient has a history of coronary artery disease with an MI greater than 10 years ago, he has a history of diabetes and hyperlipidemia.  He does not smoke.  Patient reports he was walking with his wife today and developed severe chest pain which was pressure-like and radiated to his jaw.  No radiation to the back.  No shortness of breath.  No pleurisy.  He reports he went home and sat down and took nitroglycerin with no improvement.  He then came to the emergency department.  Now he reports his pain has significantly improved.  Dr. Darrold Junker is his cardiologist  Past Medical History:  Diagnosis Date  . Chronic back pain   . Diabetes mellitus without complication (HCC)   . Hyperlipidemia   . Hypertension   . Myocardial infarction [facility]) 2007, 2009  . Peripheral neuropathy     Patient Active Problem List   Diagnosis Date Noted  . Groin pain, right 03/04/2016    Past Surgical History:  Procedure Laterality Date  . APPENDECTOMY  1970's  . BACK SURGERY  1997  . COLONOSCOPY  2017  . HERNIA REPAIR Left 02/08/2008   Dr Lemar Livings  . INGUINAL HERNIA REPAIR Right 1999   with mesh while in [place]    Prior to Admission medications   Medication Sig Start Date End Date Taking? Authorizing Provider  aspirin 81 MG chewable tablet Chew by mouth.    [provider]  atorvastatin (LIPITOR) 10 MG tablet TAKE 1 TABLET EVERY DAY 04/16/15   [provider]  Blood Glucose Monitoring Suppl (GLUCOCOM BLOOD GLUCOSE MONITOR) DEVI 1 each by XX route as directed. Dx E11.9 03/02/14   [provider]  FIBER PO Take by mouth as needed.    [provider]    gabapentin (NEURONTIN) 600 MG tablet Take by mouth. 06/07/15   [provider]  glimepiride (AMARYL) 4 MG tablet Take by mouth. 06/07/15 06/06/16  [provider]  isosorbide mononitrate (IMDUR) 30 MG 24 hr tablet Take 30 mg by mouth daily.    [provider]  lisinopril (PRINIVIL,ZESTRIL) 10 MG tablet TAKE 1 TABLET EVERY DAY 01/11/16   [provider]  metFORMIN (GLUCOPHAGE) 1000 MG tablet Take by mouth. 06/07/15   [provider]  metoprolol (LOPRESSOR) 50 MG tablet TAKE 1 TABLET TWICE DAILY 12/10/15   [provider]  pioglitazone (ACTOS) 30 MG tablet TAKE 1 TABLET ONE TIME DAILY 12/11/15   [provider]  traMADol (ULTRAM) 50 MG tablet Take 1 tablet (50 mg total) by mouth every 6 (six) hours as needed. 10/17/16   Chinita Pester, FNP     Allergies Patient has no known allergies.  History reviewed. No pertinent family history.  Social History Social History   Tobacco Use  . Smoking status: Former Smoker    Years: 40.00    Last attempt to quit: 02/24/1998    Years since quitting: 19.8  . Smokeless tobacco: Never Used  Substance Use Topics  . Alcohol use: No  . Drug use: No    Review of Systems  Constitutional: No fever/chills Eyes: No visual changes.  ENT: No sore throat. Cardiovascular: As above Respiratory: Denies  shortness of breath. Gastrointestinal: No abdominal pain.  No nausea, no vomiting.   Genitourinary: Negative for dysuria. Musculoskeletal: Negative for back pain. Skin: Negative for rash. Neurological: Negative for headaches   ____________________________________________   PHYSICAL EXAM:  VITAL SIGNS: ED Triage Vitals  Enc Vitals Group     BP 12/12/17 1722 (!) 194/114     Pulse Rate 12/12/17 1722 100     Resp 12/12/17 1722 18     Temp 12/12/17 1722 98 F (36.7 C)     Temp src --      SpO2 12/12/17 1722 96 %     Weight 12/12/17 1723 74.8 kg (165 lb)     Height 12/12/17 1723 1.676 m (5'  6")     Head Circumference --      Peak Flow --      Pain Score 12/12/17 1723 8     Pain Loc --      Pain Edu? --      Excl. in GC? --     Constitutional: Alert and oriented. No acute distress. Pleasant and interactive Eyes: Conjunctivae are normal.   Nose: No congestion/rhinnorhea. Mouth/Throat: Mucous membranes are moist.    Cardiovascular: Normal rate, regular rhythm. Grossly normal heart sounds.  Good peripheral circulation. Respiratory: Normal respiratory effort.  No retractions. Lungs CTAB. Gastrointestinal: Soft and nontender. No distention.  No CVA tenderness. Genitourinary: deferred Musculoskeletal: No lower extremity tenderness nor edema.  Warm and well perfused Neurologic:  Normal speech and language. No gross focal neurologic deficits are appreciated.  Skin:  Skin is warm, dry and intact. No rash noted. Psychiatric: Mood and affect are normal. Speech and behavior are normal.  ____________________________________________   LABS (all labs ordered are listed, but only abnormal results are displayed)  Labs Reviewed  BASIC METABOLIC PANEL - Abnormal; Notable for the following components:      Result Value   Glucose, Bld 237 (*)    All other components within normal limits  CBC  TROPONIN I   ____________________________________________  EKG  ED ECG REPORT I, Jene Every, the attending physician, personally viewed and interpreted this ECG.  Date: 12/12/2017  Rhythm: Tachycardia QRS Axis: normal Intervals: normal ST/T Wave abnormalities: Nonspecific changes Narrative Interpretation: no evidence of acute ischemia  ____________________________________________  RADIOLOGY  X-ray unremarkable ____________________________________________   PROCEDURES  Procedure(s) performed: No  Procedures   Critical Care performed: No ____________________________________________   INITIAL IMPRESSION / ASSESSMENT AND PLAN / ED COURSE  Pertinent labs & imaging  results that were available during my care of the patient were reviewed by me and considered in my medical decision making (see chart for details).  Patient with multiple risk factors for coronary artery disease presents with chest pain, now has improved significantly.  Initial troponin is normal.  EKG demonstrates tachycardia.  However given his risk factors and presentation I will admit to the hospitalist for further evaluation    ____________________________________________   FINAL CLINICAL IMPRESSION(S) / ED DIAGNOSES  Final diagnoses:  Chest pain, unspecified type        Note:  This document was prepared using Dragon voice recognition software and may include unintentional dictation errors.    Jene Every, MD 12/12/17 [phone]

## 2017-12-12 NOTE — Progress Notes (Signed)
Family Meeting Note  Advance Directive:yes  Today a meeting took place with the Patient.     The following clinical team members were present during this meeting:MD  The following were discussed:Patient's diagnosis: Chest pain with history of coronary artery disease, status post 3 stents, hypertension, hyperlipidemia, insulin requiring diabetes mellitus, chronic low back pain, peripheral neuropathy, treatment plan of care discussed in detail with the patient and his wife and stepdaughter at bedside.  They all verbalized understanding of the plan   patient's progosis: > 12 months and Goals for treatment: Full Code, wife HCPOA  Additional follow-up to be provided: Cardiology, hospitalist  Time spent during discussion:17 MIN  Ramonita Lab, MD

## 2017-12-12 NOTE — ED Triage Notes (Signed)
Pt arrives to ED c/o of central/L CP x 30 minutes. Took 1 nitro at home. Was walking when pain began. A&O x4, in wheelchair. Hx MI- 10 or 11 years ago was last.

## 2017-12-12 NOTE — ED Notes (Signed)
Pt updated on status of bed assignment. Pt verbalizes understanding.

## 2017-12-12 NOTE — ED Notes (Signed)
Report from georgie and bill, rns.

## 2017-12-12 NOTE — H&P (Signed)
[facility] - [place] at [facility]   PATIENT NAME: Gregory Mendoza    MR#:  [id number]  DATE OF BIRTH:  10/19/1945  DATE OF ADMISSION:  12/12/2017  PRIMARY CARE PHYSICIAN: Lauro Regulus, MD   REQUESTING/REFERRING PHYSICIAN: Cyril Loosen, MD  CHIEF COMPLAINT:   Chest pain HISTORY OF PRESENT ILLNESS:  Gregory Mendoza  is a 72 y.o. male with a known history of urinary artery disease with an MI 10 years ago status post 3 stents, hypertension, insulin requiring diabetes metas, hyperlipidemia and chronic low back pain is presented to the ED after he experienced chest pain radiating to the right jaw with pressure-like sensation in the chest associated with some sweating.  Patient took one sublingual nitroglycerin with no improvement and came into the emergency department.  Initial troponin is negative chest x-ray negative and EKG with sinus tachycardia at 103 bpm.  During my examination patient is chest pain-free and resting comfortably.   PAST MEDICAL HISTORY:   Past Medical History:  Diagnosis Date  . Chronic back pain   . Diabetes mellitus without complication (HCC)   . Hyperlipidemia   . Hypertension   . Myocardial infarction [facility]) 2007, 2009  . Peripheral neuropathy     PAST SURGICAL HISTOIRY:   Past Surgical History:  Procedure Laterality Date  . APPENDECTOMY  1970's  . BACK SURGERY  1997  . COLONOSCOPY  2017  . HERNIA REPAIR Left 02/08/2008   Dr Lemar Livings  . INGUINAL HERNIA REPAIR Right 1999   with mesh while in [place]    SOCIAL HISTORY:   Social History   Tobacco Use  . Smoking status: Former Smoker    Years: 40.00    Last attempt to quit: 02/24/1998    Years since quitting: 19.8  . Smokeless tobacco: Never Used  Substance Use Topics  . Alcohol use: No    FAMILY HISTORY:  History reviewed. No pertinent family history.  DRUG ALLERGIES:  No Known Allergies  REVIEW OF SYSTEMS:  CONSTITUTIONAL: No fever, fatigue or weakness.  EYES: No  blurred or double vision.  EARS, NOSE, AND THROAT: No tinnitus or ear pain.  RESPIRATORY: No cough, shortness of breath, wheezing or hemoptysis.  CARDIOVASCULAR: No chest pain, orthopnea, edema.  GASTROINTESTINAL: No nausea, vomiting, diarrhea or abdominal pain.  GENITOURINARY: No dysuria, hematuria.  ENDOCRINE: No polyuria, nocturia,  HEMATOLOGY: No anemia, easy bruising or bleeding SKIN: No rash or lesion. MUSCULOSKELETAL: No joint pain or arthritis.   NEUROLOGIC: No tingling, numbness, weakness.  PSYCHIATRY: No anxiety or depression.   MEDICATIONS AT HOME:   Prior to Admission medications   Medication Sig Start Date End Date Taking? Authorizing Provider  acetaminophen (TYLENOL) 500 MG tablet Take 500-1,000 mg by mouth every 6 (six) hours as needed for pain. 12/04/17  Yes [provider]  aspirin 81 MG chewable tablet Chew 81 mg by mouth daily.    Yes [provider]  atorvastatin (LIPITOR) 10 MG tablet Take 10 mg by mouth daily.    Yes [provider]  diclofenac sodium (VOLTAREN) 1 % GEL Apply 4 g topically 4 (four) times daily.   Yes [provider]  gabapentin (NEURONTIN) 600 MG tablet Take 600 mg by mouth 3 (three) times daily.    Yes [provider]  glimepiride (AMARYL) 4 MG tablet Take 4 mg by mouth daily with breakfast.    Yes [provider]  insulin NPH Human (HUMULIN N,NOVOLIN N) 100 UNIT/ML injection Inject 15 Units into the skin  2 (two) times daily before a meal.   Yes [provider]  isosorbide mononitrate (IMDUR) 30 MG 24 hr tablet Take 30 mg by mouth daily.   Yes [provider]  lisinopril (PRINIVIL,ZESTRIL) 10 MG tablet Take 10 mg by mouth daily.    Yes [provider]  metFORMIN (GLUCOPHAGE) 1000 MG tablet Take 1,000 mg by mouth 2 (two) times daily with a meal.    Yes [provider]  metoprolol (LOPRESSOR) 50 MG tablet Take 50 mg by mouth 2 (two) times daily.    Yes [provider]  Multiple Vitamins-Minerals (MULTIVITAMIN ADULT) TABS Take 1 tablet by mouth daily. 12/04/17  Yes [provider]  pioglitazone (ACTOS) 30 MG tablet Take 30 mg by mouth daily.    Yes [provider]  REDNESS RELIEVER EYE DROPS 0.05 % ophthalmic solution Place 1 drop into both eyes as directed. 12/04/17  Yes [provider]      VITAL SIGNS:  Blood pressure (!) 172/97, pulse 100, temperature 98 F (36.7 C), resp. rate (!) 23, height 5\' 6"  (1.676 m), weight 74.8 kg, SpO2 97 %.  PHYSICAL EXAMINATION:  GENERAL:  72 y.o.-year-old patient lying in the bed with no acute distress.  EYES: Pupils equal, round, reactive to light and accommodation. No scleral icterus. Extraocular muscles intact.  HEENT: Head atraumatic, normocephalic. Oropharynx and nasopharynx clear.  NECK:  Supple, no jugular venous distention. No thyroid enlargement, no tenderness.  LUNGS: Normal breath sounds bilaterally, no wheezing, rales,rhonchi or crepitation. No use of accessory muscles of respiration.  CARDIOVASCULAR: S1, S2 normal. No murmurs, rubs, or gallops.  ABDOMEN: Soft, nontender, nondistended. Bowel sounds present. No organomegaly or mass.  EXTREMITIES: No pedal edema, cyanosis, or clubbing.  NEUROLOGIC: Cranial nerves II through XII are intact. Muscle strength 5/5 in all extremities. Sensation intact. Gait not checked.  PSYCHIATRIC: The patient is alert and oriented x 3.  SKIN: No obvious rash, lesion, or ulcer.   LABORATORY PANEL:   CBC Recent Labs  Lab 12/12/17 1724  WBC 7.9  HGB 13.9  HCT 42.7  PLT 341   ------------------------------------------------------------------------------------------------------------------  Chemistries  Recent Labs  Lab 12/12/17 1724  NA 140  K 4.2  CL 104  CO2 28  GLUCOSE 237*  BUN 19  CREATININE 0.83  CALCIUM 9.0    ------------------------------------------------------------------------------------------------------------------  Cardiac Enzymes Recent Labs  Lab 12/12/17 1724  TROPONINI <0.03   ------------------------------------------------------------------------------------------------------------------  RADIOLOGY:  Dg Chest 2 View  Result Date: 12/12/2017 CLINICAL DATA:  Chest pain EXAM: CHEST - 2 VIEW COMPARISON:  10/27/2006 FINDINGS: Calcified granuloma posteriorly in the left lower lobe, stable. There is hyperinflation of the lungs compatible with COPD. Linear areas of scarring in the lower lungs bilaterally. No confluent airspace opacities or effusions. No acute bony abnormality. IMPRESSION: COPD/chronic changes.  No active disease. Electronically Signed   By: Charlett Nose M.D.   On: 12/12/2017 18:25    EKG:   Orders placed or performed during the hospital encounter of 12/12/17  . EKG 12-Lead  . EKG 12-Lead  . ED EKG within 10 minutes  . ED EKG within 10 minutes    IMPRESSION AND PLAN:    #Chest pain with history of coronary artery disease Admit to telemetry Cycle troponins.  Initial troponin is negative Nitroglycerin sublingually as needed for chest pain Morphine IV as needed Oxygen via nasal cannula as needed for hypoxemia to keep his sats greater than 94% Resume patient's home medication aspirin, metoprolol, Imdur, statin, ACE inhibitor  We will get an echocardiogram Cardiology consult is placed to patient's primary cardiology group-[facility].  Patient was seen by Dr. Darrold Junker in the past  #Coronary artery disease status post MI-status post 3 stents Continue home medications aspirin, Imdur, metoprolol, ACE inhibitor, statin  #Diabetes mellitus insulin requiring Continue NPH 15 units twice a day Holding metformin.  Continue other oral diabetic agents Actos, glipizide and Amaryl Sliding scale insulin  #Hyperlipidemia continue statin  #Hypertension continue beta-blocker ACE  inhibitor and titrate as needed    All the records are reviewed and case discussed with ED provider. Management plans discussed with the patient, family and they are in agreement.  CODE STATUS: FC  TOTAL TIME TAKING CARE OF THIS PATIENT: 43  minutes.   Note: This dictation was prepared with Dragon dictation along with smaller phrase technology. Any transcriptional errors that result from this process are unintentional.  Ramonita Lab M.D on 12/12/2017 at 7:24 PM  Between 7am to 6pm - Pager - [phone]  After 6pm go to [email or web address] - password [email or web address] [facility]  [facility]  Office  [phone]  CC: Primary care physician; Lauro Regulus, MD

## 2017-12-13 ENCOUNTER — Observation Stay
Admit: 2017-12-13 | Discharge: 2017-12-13 | Disposition: A | Discharge status: Home / Self Care | Payer: Medicare HMO | Attending: Internal Medicine | Admitting: Internal Medicine

## 2017-12-13 DIAGNOSIS — Z7984 Long term (current) use of oral hypoglycemic drugs: Secondary | ICD-10-CM | POA: Diagnosis not present

## 2017-12-13 DIAGNOSIS — Y831 Surgical operation with implant of artificial internal device as the cause of abnormal reaction of the patient, or of later complication, without mention of misadventure at the time of the procedure: Secondary | ICD-10-CM | POA: Diagnosis present

## 2017-12-13 DIAGNOSIS — G8929 Other chronic pain: Secondary | ICD-10-CM | POA: Diagnosis present

## 2017-12-13 DIAGNOSIS — Z7982 Long term (current) use of aspirin: Secondary | ICD-10-CM | POA: Diagnosis not present

## 2017-12-13 DIAGNOSIS — I10 Essential (primary) hypertension: Secondary | ICD-10-CM | POA: Diagnosis present

## 2017-12-13 DIAGNOSIS — Z79899 Other long term (current) drug therapy: Secondary | ICD-10-CM | POA: Diagnosis not present

## 2017-12-13 DIAGNOSIS — R079 Chest pain, unspecified: Secondary | ICD-10-CM | POA: Diagnosis present

## 2017-12-13 DIAGNOSIS — M545 Low back pain: Secondary | ICD-10-CM | POA: Diagnosis present

## 2017-12-13 DIAGNOSIS — E1142 Type 2 diabetes mellitus with diabetic polyneuropathy: Secondary | ICD-10-CM | POA: Diagnosis present

## 2017-12-13 DIAGNOSIS — Z87891 Personal history of nicotine dependence: Secondary | ICD-10-CM | POA: Diagnosis not present

## 2017-12-13 DIAGNOSIS — Z794 Long term (current) use of insulin: Secondary | ICD-10-CM | POA: Diagnosis not present

## 2017-12-13 DIAGNOSIS — I214 Non-ST elevation (NSTEMI) myocardial infarction: Secondary | ICD-10-CM | POA: Diagnosis present

## 2017-12-13 DIAGNOSIS — I2510 Atherosclerotic heart disease of native coronary artery without angina pectoris: Secondary | ICD-10-CM | POA: Diagnosis present

## 2017-12-13 DIAGNOSIS — E782 Mixed hyperlipidemia: Secondary | ICD-10-CM | POA: Diagnosis present

## 2017-12-13 DIAGNOSIS — I252 Old myocardial infarction: Secondary | ICD-10-CM | POA: Diagnosis not present

## 2017-12-13 DIAGNOSIS — T82855A Stenosis of coronary artery stent, initial encounter: Secondary | ICD-10-CM | POA: Diagnosis present

## 2017-12-13 DIAGNOSIS — Z955 Presence of coronary angioplasty implant and graft: Secondary | ICD-10-CM | POA: Diagnosis not present

## 2017-12-13 LAB — GLUCOSE, CAPILLARY: GLUCOSE-CAPILLARY: 159 mg/dL — AB (ref 70–99)

## 2017-12-13 LAB — APTT: aPTT: 46 s — ABNORMAL HIGH (ref 24–36)

## 2017-12-13 LAB — TROPONIN I
TROPONIN I: 0.22 ng/mL — AB (ref <0.03)
Troponin I: 0.29 ng/mL

## 2017-12-13 LAB — HEPARIN LEVEL (UNFRACTIONATED)
Heparin Unfractionated: 0.41 IU/mL (ref 0.30–0.70)
Heparin Unfractionated: 0.37 IU/mL (ref 0.30–0.70)

## 2017-12-13 LAB — PROTIME-INR
INR: 0.98
PROTHROMBIN TIME: 12.9 s (ref 11.4–15.2)

## 2017-12-13 LAB — ECHOCARDIOGRAM COMPLETE
Height: 66 in
WEIGHTICAEL: 2633.6 [oz_av]

## 2017-12-13 MED ORDER — HEPARIN (PORCINE) IN NACL 100-0.45 UNIT/ML-% IJ SOLN
1050.0000 [IU]/h | INTRAMUSCULAR | Status: DC
  Administered 2017-12-13 – 2017-12-14 (×2): 900 [IU]/h via INTRAVENOUS
  Filled 2017-12-13 (×2): qty 250

## 2017-12-13 MED ORDER — ASPIRIN 81 MG PO CHEW
81.0000 mg | CHEWABLE_TABLET | ORAL | Status: AC
  Administered 2017-12-14: 81 mg via ORAL
  Filled 2017-12-13: qty 1

## 2017-12-13 MED ORDER — SODIUM CHLORIDE 0.9 % WEIGHT BASED INFUSION
3.0000 mL/kg/h | INTRAVENOUS | Status: AC
  Administered 2017-12-14: 3 mL/kg/h via INTRAVENOUS

## 2017-12-13 MED ORDER — SODIUM CHLORIDE 0.9 % IV SOLN: 250.0000 mL | INTRAVENOUS | Status: DC | PRN

## 2017-12-13 MED ORDER — SODIUM CHLORIDE 0.9 % WEIGHT BASED INFUSION
1.0000 mL/kg/h | INTRAVENOUS | Status: DC
  Administered 2017-12-14: 1 mL/kg/h via INTRAVENOUS

## 2017-12-13 MED ORDER — DIPHENHYDRAMINE HCL 25 MG PO CAPS
25.0000 mg | ORAL_CAPSULE | Freq: Every evening | ORAL | Status: DC | PRN
  Administered 2017-12-13: 25 mg via ORAL
  Filled 2017-12-13: qty 1

## 2017-12-13 MED ORDER — HEPARIN BOLUS VIA INFUSION
2000.0000 [IU] | Freq: Once | INTRAVENOUS | Status: AC
  Administered 2017-12-13: 2000 [IU] via INTRAVENOUS
  Filled 2017-12-13: qty 2000

## 2017-12-13 MED ORDER — SODIUM CHLORIDE 0.9% FLUSH
3.0000 mL | Freq: Two times a day (BID) | INTRAVENOUS | Status: DC
  Administered 2017-12-13: 3 mL via INTRAVENOUS

## 2017-12-13 MED ORDER — SODIUM CHLORIDE 0.9% FLUSH: 3.0000 mL | INTRAVENOUS | Status: DC | PRN

## 2017-12-13 NOTE — Progress Notes (Signed)
Pt asking for something to help him sleep.  Sts he doesn't take anything strong.

## 2017-12-13 NOTE — Progress Notes (Signed)
ANTICOAGULATION CONSULT NOTE -   Pharmacy Consult for heparin drip Indication: chest pain/ACS  No Known Allergies  Patient Measurements: Height: 5\' 6"  (167.6 cm) Weight: 164 lb 9.6 oz (74.7 kg) IBW/kg (Calculated) : 63.8 Heparin Dosing Weight: 75 kg  Vital Signs: Temp: 97.6 F (36.4 C) (10/20 0356) Temp Source: Oral (10/20 0356) BP: 153/72 (10/20 0356) Pulse Rate: 64 (10/20 0356)  Labs: Recent Labs    12/12/17 1724 12/12/17 2047 12/13/17 0115 12/13/17 0353 12/13/17 1155  HGB 13.9  --   --   --   --   HCT 42.7  --   --   --   --   PLT 341  --   --   --   --   APTT  --   --   --  46*  --   LABPROT  --   --   --  12.9  --   INR  --   --   --  0.98  --   HEPARINUNFRC  --   --   --   --  0.41  CREATININE 0.83  --   --   --   --   TROPONINI <0.03 0.23* 0.29* 0.22*  --     Estimated Creatinine Clearance: 71.5 mL/min (by C-G formula based on SCr of 0.83 mg/dL).   Medical History: Past Medical History:  Diagnosis Date  . Chronic back pain   . Diabetes mellitus without complication (HCC)   . Hyperlipidemia   . Hypertension   . Myocardial infarction [facility]) 2007, 2009  . Peripheral neuropathy     Medications:  No anticoag in PTA meds  Assessment: Trop 0.29   Goal of Therapy:  Heparin level 0.3-0.7 units/ml Monitor platelets by anticoagulation protocol: Yes   Plan:  2000 unit bolus (lovenox at 2200) and initial rate of 900 units/hr. First heparin level 8 hours after start of infusion.    10/20 ~ 12:00 HL = 0.41.  Continue current drip rate. Recheck HL at 20:00.   Royer Cristobal K 12/13/2017,12:27 PM

## 2017-12-13 NOTE — Progress Notes (Signed)
[facility] - [place] at [facility]   PATIENT NAME: Gregory Mendoza    MR#:  [id number]  DATE OF BIRTH:  [birth year]  SUBJECTIVE:  CHIEF COMPLAINT:   Chief Complaint  Patient presents with  . Chest Pain   No further chest pain today.  No arrhythmias on telemetry  REVIEW OF SYSTEMS:    Review of Systems  Constitutional: Negative for chills and fever.  HENT: Negative for sore throat.   Eyes: Negative for blurred vision, double vision and pain.  Respiratory: Negative for cough, hemoptysis, shortness of breath and wheezing.   Cardiovascular: Negative for chest pain, palpitations, orthopnea and leg swelling.  Gastrointestinal: Negative for abdominal pain, constipation, diarrhea, heartburn, nausea and vomiting.  Genitourinary: Negative for dysuria and hematuria.  Musculoskeletal: Negative for back pain and joint pain.  Skin: Negative for rash.  Neurological: Negative for sensory change, speech change, focal weakness and headaches.  Endo/Heme/Allergies: Does not bruise/bleed easily.  Psychiatric/Behavioral: Negative for depression. The patient is not nervous/anxious.     DRUG ALLERGIES:  No Known Allergies  VITALS:  Blood pressure (!) 153/72, pulse 64, temperature 97.6 F (36.4 C), temperature source Oral, resp. rate 18, height 5\' 6"  (1.676 m), weight 74.7 kg, SpO2 95 %.  PHYSICAL EXAMINATION:   Physical Exam  GENERAL:  72 y.o.-year-old patient lying in the bed with no acute distress.  EYES: Pupils equal, round, reactive to light and accommodation. No scleral icterus. Extraocular muscles intact.  HEENT: Head atraumatic, normocephalic. Oropharynx and nasopharynx clear.  NECK:  Supple, no jugular venous distention. No thyroid enlargement, no tenderness.  LUNGS: Normal breath sounds bilaterally, no wheezing, rales, rhonchi. No use of accessory muscles of respiration.  CARDIOVASCULAR: S1, S2 normal. No murmurs, rubs, or gallops.  ABDOMEN: Soft, nontender,  nondistended. Bowel sounds present. No organomegaly or mass.  EXTREMITIES: No cyanosis, clubbing or edema b/l.    NEUROLOGIC: Cranial nerves II through XII are intact. No focal Motor or sensory deficits b/l.   PSYCHIATRIC: The patient is alert and oriented x 3.  SKIN: No obvious rash, lesion, or ulcer.   LABORATORY PANEL:   CBC Recent Labs  Lab 12/12/17 1724  WBC 7.9  HGB 13.9  HCT 42.7  PLT 341   ------------------------------------------------------------------------------------------------------------------ Chemistries  Recent Labs  Lab 12/12/17 1724  NA 140  K 4.2  CL 104  CO2 28  GLUCOSE 237*  BUN 19  CREATININE 0.83  CALCIUM 9.0   ------------------------------------------------------------------------------------------------------------------  Cardiac Enzymes Recent Labs  Lab 12/13/17 0353  TROPONINI 0.22*   ------------------------------------------------------------------------------------------------------------------  RADIOLOGY:  Dg Chest 2 View  Result Date: 12/12/2017 CLINICAL DATA:  Chest pain EXAM: CHEST - 2 VIEW COMPARISON:  10/27/2006 FINDINGS: Calcified granuloma posteriorly in the left lower lobe, stable. There is hyperinflation of the lungs compatible with COPD. Linear areas of scarring in the lower lungs bilaterally. No confluent airspace opacities or effusions. No acute bony abnormality. IMPRESSION: COPD/chronic changes.  No active disease. Electronically Signed   By: Charlett Nose M.D.   On: 12/12/2017 18:25     ASSESSMENT AND PLAN:   * NSTEMI ON ASA, heparin drip Statin Tele monitoring Echo done and pending  * HTN Restarted home meds and trending down  *Insulin-dependent diabetes mellitus.  Continued on long-acting insulin.  Sliding scale insulin.  Diabetic diet  DVT prophylaxis.  On heparin drip.  All the records are reviewed and case discussed with Care Management/Social Workerr. Management plans discussed with the patient,  family and they are in agreement.  CODE STATUS: FULL CODE  TOTAL TIME TAKING CARE OF THIS PATIENT: .   POSSIBLE D/C IN 1-2 DAYS, DEPENDING ON CLINICAL CONDITION.  Molinda Bailiff Kaitlen Redford M.D on 12/13/2017 at 12:21 PM  Between 7am to 6pm - Pager - [phone]  After 6pm go to [email or web address] - password [email or web address] [facility]  [facility] [place] [facility]  Office  [phone]  CC: Primary care physician; Lauro Regulus, MD  Note: This dictation was prepared with Dragon dictation along with smaller phrase technology. Any transcriptional errors that result from this process are unintentional.

## 2017-12-13 NOTE — Consult Note (Signed)
[facility] Cardiology Consultation Note  Patient ID: Gregory Mendoza, MRN: [id number], DOB/AGE: March 22, 1945 72 y.o. Admit date: 12/12/2017   Date of Consult: 12/13/2017 Primary Physician: Lauro Regulus, MD Primary Cardiologist: Higinio Roger  Chief Complaint:  Chief Complaint  Patient presents with  . Chest Pain   Reason for Consult: Chest pain  HPI: 72 y.o. male with known diabetes with complication essential hypertension mixed hyperlipidemia status post previous inferior myocardial infarction PCI and stent placement and right coronary artery in 2008 who is done well on appropriate medication management including moderate intensive cholesterol therapy metoprolol and lisinopril.  The patient's diabetes is under as relative control.  The patient has had no evidence of issues until yesterday walking with his wife when he had severe substernal chest discomfort radiating into his back and unabated for several hours.  When seen in the emergency room he had an EKG showing normal sinus rhythm with preventricular contractions left axis deviation nonspecific ST changes.  Additionally a chest x-ray was normal.  Troponin level has elevated to 0.22 consistent with non-ST elevation myocardial infarction.  He had full resolution of his chest pain with appropriate therapy including heparin and nitrates as well as beta-blocker and ACE inhibitor.  Currently he is comfortable with no further symptoms  Past Medical History:  Diagnosis Date  . Chronic back pain   . Diabetes mellitus without complication (HCC)   . Hyperlipidemia   . Hypertension   . Myocardial infarction [facility]) 2007, 2009  . Peripheral neuropathy       Surgical History:  Past Surgical History:  Procedure Laterality Date  . APPENDECTOMY  1970's  . BACK SURGERY  1997  . COLONOSCOPY  2017  . HERNIA REPAIR Left 02/08/2008   Dr Lemar Livings  . INGUINAL HERNIA REPAIR Right 1999   with mesh while in [place]     Home Meds: Prior to  Admission medications   Medication Sig Start Date End Date Taking? Authorizing Provider  acetaminophen (TYLENOL) 500 MG tablet Take 500-1,000 mg by mouth every 6 (six) hours as needed for pain. 12/04/17  Yes [provider]  aspirin 81 MG chewable tablet Chew 81 mg by mouth daily.    Yes [provider]  atorvastatin (LIPITOR) 10 MG tablet Take 10 mg by mouth daily.    Yes [provider]  diclofenac sodium (VOLTAREN) 1 % GEL Apply 4 g topically 4 (four) times daily.   Yes [provider]  gabapentin (NEURONTIN) 600 MG tablet Take 600 mg by mouth 3 (three) times daily.    Yes [provider]  glimepiride (AMARYL) 4 MG tablet Take 4 mg by mouth daily with breakfast.    Yes [provider]  insulin NPH Human (HUMULIN N,NOVOLIN N) 100 UNIT/ML injection Inject 15 Units into the skin 2 (two) times daily before a meal.   Yes [provider]  isosorbide mononitrate (IMDUR) 30 MG 24 hr tablet Take 30 mg by mouth daily.   Yes [provider]  lisinopril (PRINIVIL,ZESTRIL) 10 MG tablet Take 10 mg by mouth daily.    Yes [provider]  metFORMIN (GLUCOPHAGE) 1000 MG tablet Take 1,000 mg by mouth 2 (two) times daily with a meal.    Yes [provider]  metoprolol (LOPRESSOR) 50 MG tablet Take 50 mg by mouth 2 (two) times daily.    Yes [provider]  Multiple Vitamins-Minerals (MULTIVITAMIN ADULT) TABS Take 1 tablet by mouth daily. 12/04/17  Yes [provider]  pioglitazone (  ACTOS) 30 MG tablet Take 30 mg by mouth daily.    Yes [provider]  REDNESS RELIEVER EYE DROPS 0.05 % ophthalmic solution Place 1 drop into both eyes as directed. 12/04/17  Yes [provider]    Inpatient Medications:  . aspirin  81 mg Oral Daily  . atorvastatin  10 mg Oral Daily  . diclofenac sodium  4 g Topical QID  . gabapentin  600 mg Oral TID  . glimepiride  4 mg Oral Q breakfast  . insulin detemir   15 Units Subcutaneous BID  . isosorbide mononitrate  30 mg Oral Daily  . lisinopril  10 mg Oral Daily  . metoprolol tartrate  50 mg Oral BID  . multivitamin with minerals  1 tablet Oral Daily  . pioglitazone  30 mg Oral Daily   . heparin 900 Units/hr (12/13/17 0356)    Allergies: No Known Allergies  Social History   Socioeconomic History  . Marital status: Married    Spouse name: Not on file  . Number of children: Not on file  . Years of education: Not on file  . Highest education level: Not on file  Occupational History  . Not on file  Social Needs  . Financial resource strain: Not on file  . Food insecurity:    Worry: Not on file    Inability: Not on file  . Transportation needs:    Medical: Not on file    Non-medical: Not on file  Tobacco Use  . Smoking status: Former Smoker    Years: 40.00    Last attempt to quit: 02/24/1998    Years since quitting: 19.8  . Smokeless tobacco: Never Used  Substance and Sexual Activity  . Alcohol use: No  . Drug use: No  . Sexual activity: Not on file  Lifestyle  . Physical activity:    Days per week: Not on file    Minutes per session: Not on file  . Stress: Not on file  Relationships  . Social connections:    Talks on phone: Not on file    Gets together: Not on file    Attends religious service: Not on file    Active member of club or organization: Not on file    Attends meetings of clubs or organizations: Not on file    Relationship status: Not on file  . Intimate partner violence:    Fear of current or ex partner: Not on file    Emotionally abused: Not on file    Physically abused: Not on file    Forced sexual activity: Not on file  Other Topics Concern  . Not on file  Social History Narrative  . Not on file     History reviewed. No pertinent family history.   Review of Systems Positive for chest pain Negative for: General:  chills, fever, night sweats or weight changes.  Cardiovascular: PND orthopnea syncope  dizziness  Dermatological skin lesions rashes Respiratory: Cough congestion Urologic: Frequent urination urination at night and hematuria Abdominal: negative for nausea, vomiting, diarrhea, bright red blood per rectum, melena, or hematemesis Neurologic: negative for visual changes, and/or hearing changes  All other systems reviewed and are otherwise negative except as noted above.  Labs: Recent Labs    12/12/17 1724 12/12/17 2047 12/13/17 0115 12/13/17 0353  TROPONINI <0.03 0.23* 0.29* 0.22*   Lab Results  Component Value Date   WBC 7.9 12/12/2017   HGB 13.9 12/12/2017   HCT 42.7 12/12/2017  MCV 93.4 12/12/2017   PLT 341 12/12/2017    Recent Labs  Lab 12/12/17 1724  NA 140  K 4.2  CL 104  CO2 28  BUN 19  CREATININE 0.83  CALCIUM 9.0  GLUCOSE 237*   No results found for: CHOL, HDL, LDLCALC, TRIG No results found for: DDIMER  Radiology/Studies:  Dg Chest 2 View  Result Date: 12/12/2017 CLINICAL DATA:  Chest pain EXAM: CHEST - 2 VIEW COMPARISON:  10/27/2006 FINDINGS: Calcified granuloma posteriorly in the left lower lobe, stable. There is hyperinflation of the lungs compatible with COPD. Linear areas of scarring in the lower lungs bilaterally. No confluent airspace opacities or effusions. No acute bony abnormality. IMPRESSION: COPD/chronic changes.  No active disease. Electronically Signed   By: Charlett Nose M.D.   On: 12/12/2017 18:25    EKG: Sinus rhythm with preventricular contractions left axis to duration  Weights: Filed Weights   12/12/17 1723 12/12/17 2033  Weight: 74.8 kg 74.7 kg     Physical Exam: Blood pressure (!) 153/72, pulse 64, temperature 97.6 F (36.4 C), temperature source Oral, resp. rate 18, height 5\' 6"  (1.676 m), weight 74.7 kg, SpO2 95 %. Body mass index is 26.57 kg/m. General: Well developed, well nourished, in no acute distress. Head eyes ears nose throat: Normocephalic, atraumatic, sclera non-icteric, no xanthomas, nares are without  discharge. No apparent thyromegaly and/or mass  Lungs: Normal respiratory effort.  no wheezes, no rales, no rhonchi.  Heart: RRR with normal S1 S2. no murmur gallop, no rub, PMI is normal size and placement, carotid upstroke normal without bruit, jugular venous pressure is normal Abdomen: Soft, non-tender, non-distended with normoactive bowel sounds. No hepatomegaly. No rebound/guarding. No obvious abdominal masses. Abdominal aorta is normal size without bruit Extremities: No edema. no cyanosis, no clubbing, no ulcers  Peripheral : 2+ bilateral upper extremity pulses, 2+ bilateral femoral pulses, 2+ bilateral dorsal pedal pulse Neuro: Alert and oriented. No facial asymmetry. No focal deficit. Moves all extremities spontaneously. Musculoskeletal: Normal muscle tone without kyphosis Psych:  Responds to questions appropriately with a normal affect.    Assessment: 72 year old male with known cardiovascular disease hyper tension hyperlipidemia diabetes with complication having a non-ST elevation myocardial infarction  Plan: 1.  Continue heparin for further risk reduction in myocardial infarction 2.  Aspirin for myocardial infarction 3.  Metoprolol lisinopril for hypertension control and myocardial infarction 4.  High intensity cholesterol therapy and change atorvastatin to 80 mg 5.  Proceed to cardiac catheterization to assess coronary anatomy and further treatment thereof is necessary.  Patient understands risk and benefits of cardiac catheterization.  This includes a possibility of death stroke heart attack infection bleeding or blood clot.  He is at low risk for conscious sedation  Signed, Lamar Blinks M.D. [facility] 12/13/2017, 7:58 AM

## 2017-12-13 NOTE — Progress Notes (Signed)
ANTICOAGULATION CONSULT NOTE -   Pharmacy Consult for heparin drip Indication: chest pain/ACS  No Known Allergies  Patient Measurements: Height: 5\' 6"  (167.6 cm) Weight: 164 lb 9.6 oz (74.7 kg) IBW/kg (Calculated) : 63.8 Heparin Dosing Weight: 75 kg  Vital Signs: Temp: 98.2 F (36.8 C) (10/20 1933) Temp Source: Oral (10/20 1933) BP: 173/82 (10/20 1933) Pulse Rate: 101 (10/20 1933)  Labs: Recent Labs    12/12/17 1724 12/12/17 2047 12/13/17 0115 12/13/17 0353 12/13/17 1155 12/13/17 2026  HGB 13.9  --   --   --   --   --   HCT 42.7  --   --   --   --   --   PLT 341  --   --   --   --   --   APTT  --   --   --  46*  --   --   LABPROT  --   --   --  12.9  --   --   INR  --   --   --  0.98  --   --   HEPARINUNFRC  --   --   --   --  0.41 0.37  CREATININE 0.83  --   --   --   --   --   TROPONINI <0.03 0.23* 0.29* 0.22*  --   --     Estimated Creatinine Clearance: 71.5 mL/min (by C-G formula based on SCr of 0.83 mg/dL).   Medical History: Past Medical History:  Diagnosis Date  . Chronic back pain   . Diabetes mellitus without complication (HCC)   . Hyperlipidemia   . Hypertension   . Myocardial infarction [facility]) 2007, 2009  . Peripheral neuropathy     Medications:  No anticoag in PTA meds  Assessment: Trop 0.29   Goal of Therapy:  Heparin level 0.3-0.7 units/ml Monitor platelets by anticoagulation protocol: Yes   Plan:  10/20 20:00 Heparin level = 0.37 is within range, continue with current rate of 900 units/hr. Next Heparin level with AM labs. Daily CBC while on Heparin drip.  Clovia Cuff, PharmD, BCPS 12/13/2017 9:09 PM

## 2017-12-13 NOTE — Progress Notes (Signed)
*  PRELIMINARY RESULTS* Echocardiogram 2D Echocardiogram has been performed.  Garrel Ridgel Meaghan Whistler 12/13/2017, 9:38 AM

## 2017-12-13 NOTE — Progress Notes (Signed)
ANTICOAGULATION CONSULT NOTE - Initial Consult  Pharmacy Consult for heparin drip Indication: chest pain/ACS  No Known Allergies  Patient Measurements: Height: 5\' 6"  (167.6 cm) Weight: 164 lb 9.6 oz (74.7 kg) IBW/kg (Calculated) : 63.8 Heparin Dosing Weight: 75 kg  Vital Signs: Temp: 98.8 F (37.1 C) (10/19 2036) Temp Source: Oral (10/19 2036) BP: 193/93 (10/19 2036) Pulse Rate: 99 (10/19 2036)  Labs: Recent Labs    12/12/17 1724 12/12/17 2047 12/13/17 0115  HGB 13.9  --   --   HCT 42.7  --   --   PLT 341  --   --   CREATININE 0.83  --   --   TROPONINI <0.03 0.23* 0.29*    Estimated Creatinine Clearance: 71.5 mL/min (by C-G formula based on SCr of 0.83 mg/dL).   Medical History: Past Medical History:  Diagnosis Date  . Chronic back pain   . Diabetes mellitus without complication (HCC)   . Hyperlipidemia   . Hypertension   . Myocardial infarction [facility]) 2007, 2009  . Peripheral neuropathy     Medications:  No anticoag in PTA meds  Assessment: Trop 0.29   Goal of Therapy:  Heparin level 0.3-0.7 units/ml Monitor platelets by anticoagulation protocol: Yes   Plan:  2000 unit bolus (lovenox at 2200) and initial rate of 900 units/hr. First heparin level 8 hours after start of infusion.  Linkin Vizzini S 12/13/2017,3:07 AM

## 2017-12-14 ENCOUNTER — Other Ambulatory Visit: Payer: Self-pay

## 2017-12-14 ENCOUNTER — Encounter
Admission: EM | Disposition: A | Discharge status: Home / Self Care | Payer: Self-pay | Source: Home / Self Care | Attending: Internal Medicine

## 2017-12-14 HISTORY — PX: LEFT HEART CATH AND CORONARY ANGIOGRAPHY: CATH118249

## 2017-12-14 HISTORY — PX: CORONARY STENT INTERVENTION: CATH118234

## 2017-12-14 LAB — CBC
HCT: 44.9 % (ref 39.0–52.0)
Hemoglobin: 14.6 g/dL (ref 13.0–17.0)
MCH: 30.3 pg (ref 26.0–34.0)
MCHC: 32.5 g/dL (ref 30.0–36.0)
MCV: 93.2 fL (ref 80.0–100.0)
PLATELETS: 367 10*3/uL (ref 150–400)
RBC: 4.82 MIL/uL (ref 4.22–5.81)
RDW: 12.8 % (ref 11.5–15.5)
WBC: 8.6 10*3/uL (ref 4.0–10.5)
nRBC: 0 % (ref 0.0–0.2)

## 2017-12-14 LAB — POCT ACTIVATED CLOTTING TIME: Activated Clotting Time: 329 seconds

## 2017-12-14 LAB — GLUCOSE, CAPILLARY
GLUCOSE-CAPILLARY: 106 mg/dL — AB (ref 70–99)
GLUCOSE-CAPILLARY: 105 mg/dL — AB (ref 70–99)
Glucose-Capillary: 189 mg/dL — ABNORMAL HIGH (ref 70–99)
Glucose-Capillary: 323 mg/dL — ABNORMAL HIGH (ref 70–99)

## 2017-12-14 LAB — HEPARIN LEVEL (UNFRACTIONATED): HEPARIN UNFRACTIONATED: 0.29 [IU]/mL — AB (ref 0.30–0.70)

## 2017-12-14 SURGERY — LEFT HEART CATH AND CORONARY ANGIOGRAPHY
Anesthesia: Moderate Sedation

## 2017-12-14 MED ORDER — IOPAMIDOL (ISOVUE-300) INJECTION 61%
INTRAVENOUS | Status: DC | PRN
  Administered 2017-12-14: 140 mL via INTRA_ARTERIAL

## 2017-12-14 MED ORDER — HYDRALAZINE HCL 20 MG/ML IJ SOLN
INTRAMUSCULAR | Status: AC
  Filled 2017-12-14: qty 1

## 2017-12-14 MED ORDER — MIDAZOLAM HCL 2 MG/2ML IJ SOLN
INTRAMUSCULAR | Status: DC | PRN
  Administered 2017-12-14: 1 mg via INTRAVENOUS

## 2017-12-14 MED ORDER — ASPIRIN 81 MG PO CHEW
CHEWABLE_TABLET | ORAL | Status: DC | PRN
  Administered 2017-12-14: 243 mg via ORAL

## 2017-12-14 MED ORDER — HEPARIN BOLUS VIA INFUSION
1100.0000 [IU] | Freq: Once | INTRAVENOUS | Status: AC
  Administered 2017-12-14: 1100 [IU] via INTRAVENOUS
  Filled 2017-12-14: qty 1100

## 2017-12-14 MED ORDER — BIVALIRUDIN TRIFLUOROACETATE 250 MG IV SOLR
INTRAVENOUS | Status: AC
  Filled 2017-12-14: qty 250

## 2017-12-14 MED ORDER — SODIUM CHLORIDE 0.9 % IV SOLN
0.2500 mg/kg/h | INTRAVENOUS | Status: AC
  Filled 2017-12-14: qty 250

## 2017-12-14 MED ORDER — INSULIN ASPART 100 UNIT/ML ~~LOC~~ SOLN
0.0000 [IU] | Freq: Three times a day (TID) | SUBCUTANEOUS | Status: DC
  Administered 2017-12-15 (×2): 2 [IU] via SUBCUTANEOUS
  Filled 2017-12-14 (×2): qty 1

## 2017-12-14 MED ORDER — SODIUM CHLORIDE 0.9 % IV SOLN
INTRAVENOUS | Status: AC | PRN
  Administered 2017-12-14: 1.75 mg/kg/h via INTRAVENOUS

## 2017-12-14 MED ORDER — ASPIRIN 81 MG PO CHEW
CHEWABLE_TABLET | ORAL | Status: AC
  Filled 2017-12-14: qty 3

## 2017-12-14 MED ORDER — LABETALOL HCL 5 MG/ML IV SOLN
INTRAVENOUS | Status: DC | PRN
  Administered 2017-12-14 (×2): 10 mg via INTRAVENOUS

## 2017-12-14 MED ORDER — INSULIN ASPART 100 UNIT/ML ~~LOC~~ SOLN
0.0000 [IU] | Freq: Every day | SUBCUTANEOUS | Status: DC
  Administered 2017-12-14: 4 [IU] via SUBCUTANEOUS
  Filled 2017-12-14: qty 1

## 2017-12-14 MED ORDER — FENTANYL CITRATE (PF) 100 MCG/2ML IJ SOLN
INTRAMUSCULAR | Status: DC | PRN
  Administered 2017-12-14: 25 ug via INTRAVENOUS

## 2017-12-14 MED ORDER — SODIUM CHLORIDE 0.9 % IV SOLN: INTRAVENOUS | Status: DC | PRN

## 2017-12-14 MED ORDER — SODIUM CHLORIDE 0.9 % IV SOLN: 250.0000 mL | INTRAVENOUS | Status: DC | PRN

## 2017-12-14 MED ORDER — ASPIRIN 81 MG PO CHEW
81.0000 mg | CHEWABLE_TABLET | Freq: Every day | ORAL | Status: DC
  Administered 2017-12-15: 81 mg via ORAL

## 2017-12-14 MED ORDER — ENOXAPARIN SODIUM 40 MG/0.4ML ~~LOC~~ SOLN
40.0000 mg | SUBCUTANEOUS | Status: DC
  Administered 2017-12-14: 40 mg via SUBCUTANEOUS
  Filled 2017-12-14: qty 0.4

## 2017-12-14 MED ORDER — FENTANYL CITRATE (PF) 100 MCG/2ML IJ SOLN
INTRAMUSCULAR | Status: AC
  Filled 2017-12-14: qty 2

## 2017-12-14 MED ORDER — ONDANSETRON HCL 4 MG/2ML IJ SOLN
4.0000 mg | Freq: Four times a day (QID) | INTRAMUSCULAR | Status: DC | PRN
  Administered 2017-12-14: 4 mg via INTRAVENOUS

## 2017-12-14 MED ORDER — SODIUM CHLORIDE 0.9% FLUSH: 3.0000 mL | INTRAVENOUS | Status: DC | PRN

## 2017-12-14 MED ORDER — ATORVASTATIN CALCIUM 20 MG PO TABS
80.0000 mg | ORAL_TABLET | Freq: Every day | ORAL | Status: DC
  Administered 2017-12-15: 80 mg via ORAL
  Filled 2017-12-14: qty 4

## 2017-12-14 MED ORDER — IOPAMIDOL (ISOVUE-300) INJECTION 61%
INTRAVENOUS | Status: DC | PRN
  Administered 2017-12-14: 120 mL via INTRA_ARTERIAL

## 2017-12-14 MED ORDER — TICAGRELOR 90 MG PO TABS
90.0000 mg | ORAL_TABLET | Freq: Two times a day (BID) | ORAL | Status: DC
  Administered 2017-12-14 – 2017-12-15 (×2): 90 mg via ORAL
  Filled 2017-12-14 (×2): qty 1

## 2017-12-14 MED ORDER — MIDAZOLAM HCL 2 MG/2ML IJ SOLN
INTRAMUSCULAR | Status: AC
  Filled 2017-12-14: qty 2

## 2017-12-14 MED ORDER — TICAGRELOR 90 MG PO TABS
ORAL_TABLET | ORAL | Status: AC
  Filled 2017-12-14: qty 2

## 2017-12-14 MED ORDER — FENTANYL CITRATE (PF) 100 MCG/2ML IJ SOLN
INTRAMUSCULAR | Status: DC | PRN
  Administered 2017-12-14: 50 ug via INTRAVENOUS
  Administered 2017-12-14: 25 ug via INTRAVENOUS

## 2017-12-14 MED ORDER — TICAGRELOR 90 MG PO TABS
ORAL_TABLET | ORAL | Status: DC | PRN
  Administered 2017-12-14: 180 mg via ORAL

## 2017-12-14 MED ORDER — LABETALOL HCL 5 MG/ML IV SOLN
INTRAVENOUS | Status: AC
  Filled 2017-12-14: qty 4

## 2017-12-14 MED ORDER — HEPARIN (PORCINE) IN NACL 1000-0.9 UT/500ML-% IV SOLN
INTRAVENOUS | Status: AC
  Filled 2017-12-14: qty 1000

## 2017-12-14 MED ORDER — SODIUM CHLORIDE 0.9% FLUSH
3.0000 mL | Freq: Two times a day (BID) | INTRAVENOUS | Status: DC
  Administered 2017-12-14: 3 mL via INTRAVENOUS

## 2017-12-14 MED ORDER — ONDANSETRON HCL 4 MG/2ML IJ SOLN
INTRAMUSCULAR | Status: AC
  Filled 2017-12-14: qty 2

## 2017-12-14 MED ORDER — HYDRALAZINE HCL 20 MG/ML IJ SOLN: 5.0000 mg | INTRAMUSCULAR | Status: AC | PRN

## 2017-12-14 MED ORDER — HYDRALAZINE HCL 20 MG/ML IJ SOLN
INTRAMUSCULAR | Status: DC | PRN
  Administered 2017-12-14 (×2): 10 mg via INTRAVENOUS

## 2017-12-14 MED ORDER — LABETALOL HCL 5 MG/ML IV SOLN: 10.0000 mg | INTRAVENOUS | Status: AC | PRN

## 2017-12-14 MED ORDER — TICAGRELOR 90 MG PO TABS: ORAL_TABLET | ORAL | Status: DC | PRN

## 2017-12-14 MED ORDER — ACETAMINOPHEN 325 MG PO TABS
650.0000 mg | ORAL_TABLET | ORAL | Status: DC | PRN
  Administered 2017-12-15: 650 mg via ORAL

## 2017-12-14 MED ORDER — BIVALIRUDIN BOLUS VIA INFUSION - CUPID
INTRAVENOUS | Status: DC | PRN
  Administered 2017-12-14: 54.6 mg via INTRAVENOUS

## 2017-12-14 MED ORDER — SODIUM CHLORIDE 0.9 % WEIGHT BASED INFUSION
1.0000 mL/kg/h | INTRAVENOUS | Status: AC
  Administered 2017-12-14: 1 mL/kg/h via INTRAVENOUS

## 2017-12-14 MED ORDER — OXYCODONE-ACETAMINOPHEN 5-325 MG PO TABS
1.0000 | ORAL_TABLET | Freq: Four times a day (QID) | ORAL | Status: DC | PRN
  Administered 2017-12-14: 1 via ORAL
  Filled 2017-12-14: qty 1

## 2017-12-14 SURGICAL SUPPLY — 19 items
BALLN TREK RX 2.5X15 (BALLOONS) ×3
BALLN ~~LOC~~ TREK RX 3.5X15 (BALLOONS) ×3
BALLOON TREK RX 2.5X15 (BALLOONS) ×1 IMPLANT
BALLOON ~~LOC~~ TREK RX 3.5X15 (BALLOONS) ×1 IMPLANT
CATH INFINITI 5FR ANG PIGTAIL (CATHETERS) ×3 IMPLANT
CATH INFINITI 5FR JL4 (CATHETERS) ×3 IMPLANT
CATH INFINITI JR4 5F (CATHETERS) ×3 IMPLANT
CATH VISTA GUIDE 6FR JR4 SH (CATHETERS) ×3 IMPLANT
DEVICE CLOSURE MYNXGRIP 6/7F (Vascular Products) ×3 IMPLANT
DEVICE INFLAT 30 PLUS (MISCELLANEOUS) ×3 IMPLANT
KIT MANI 3VAL PERCEP (MISCELLANEOUS) ×3 IMPLANT
NEEDLE PERC 18GX7CM (NEEDLE) ×3 IMPLANT
PACK CARDIAC CATH (CUSTOM PROCEDURE TRAY) ×3 IMPLANT
SHEATH AVANTI 5FR X 11CM (SHEATH) ×3 IMPLANT
SHEATH AVANTI 6FR X 11CM (SHEATH) ×3 IMPLANT
STENT SIERRA 2.50 X 15 MM (Permanent Stent) ×3 IMPLANT
STENT SIERRA 3.00 X 28 MM (Permanent Stent) ×3 IMPLANT
WIRE G HI TQ BMW 190 (WIRE) ×3 IMPLANT
WIRE GUIDERIGHT .035X150 (WIRE) ×3 IMPLANT

## 2017-12-14 NOTE — Progress Notes (Signed)
[facility] - [place] at [facility]   PATIENT NAME: Gregory Mendoza    MR#:  [id number]  DATE OF BIRTH:  [birth year]  SUBJECTIVE:  CHIEF COMPLAINT:   Chief Complaint  Patient presents with  . Chest Pain   Patient seen and evaluated today No new episodes of chest pain  REVIEW OF SYSTEMS:    Review of Systems  Constitutional: Negative for chills and fever.  HENT: Negative for sore throat.   Eyes: Negative for blurred vision, double vision and pain.  Respiratory: Negative for cough, hemoptysis, shortness of breath and wheezing.   Cardiovascular: Negative for chest pain, palpitations, orthopnea and leg swelling.  Gastrointestinal: Negative for abdominal pain, constipation, diarrhea, heartburn, nausea and vomiting.  Genitourinary: Negative for dysuria and hematuria.  Musculoskeletal: Negative for back pain and joint pain.  Skin: Negative for rash.  Neurological: Negative for sensory change, speech change, focal weakness and headaches.  Endo/Heme/Allergies: Does not bruise/bleed easily.  Psychiatric/Behavioral: Negative for depression. The patient is not nervous/anxious.     DRUG ALLERGIES:  No Known Allergies  VITALS:  Blood pressure 135/82, pulse 86, temperature 98.1 F (36.7 C), temperature source Oral, resp. rate 12, height 5\' 6"  (1.676 m), weight 72.8 kg, SpO2 97 %.  PHYSICAL EXAMINATION:   Physical Exam  GENERAL:  72 y.o.-year-old patient lying in the bed with no acute distress.  EYES: Pupils equal, round, reactive to light and accommodation. No scleral icterus. Extraocular muscles intact.  HEENT: Head atraumatic, normocephalic. Oropharynx and nasopharynx clear.  NECK:  Supple, no jugular venous distention. No thyroid enlargement, no tenderness.  LUNGS: Normal breath sounds bilaterally, no wheezing, rales, rhonchi. No use of accessory muscles of respiration.  CARDIOVASCULAR: S1, S2 normal. No murmurs, rubs, or gallops.  ABDOMEN: Soft, nontender,  nondistended. Bowel sounds present. No organomegaly or mass.  EXTREMITIES: No cyanosis, clubbing or edema b/l.    NEUROLOGIC: Cranial nerves II through XII are intact. No focal Motor or sensory deficits b/l.   PSYCHIATRIC: The patient is alert and oriented x 3.  SKIN: No obvious rash, lesion, or ulcer.   LABORATORY PANEL:   CBC Recent Labs  Lab 12/14/17 0417  WBC 8.6  HGB 14.6  HCT 44.9  PLT 367   ------------------------------------------------------------------------------------------------------------------ Chemistries  Recent Labs  Lab 12/12/17 1724  NA 140  K 4.2  CL 104  CO2 28  GLUCOSE 237*  BUN 19  CREATININE 0.83  CALCIUM 9.0   ------------------------------------------------------------------------------------------------------------------  Cardiac Enzymes Recent Labs  Lab 12/13/17 0353  TROPONINI 0.22*   ------------------------------------------------------------------------------------------------------------------  RADIOLOGY:  Dg Chest 2 View  Result Date: 12/12/2017 CLINICAL DATA:  Chest pain EXAM: CHEST - 2 VIEW COMPARISON:  10/27/2006 FINDINGS: Calcified granuloma posteriorly in the left lower lobe, stable. There is hyperinflation of the lungs compatible with COPD. Linear areas of scarring in the lower lungs bilaterally. No confluent airspace opacities or effusions. No acute bony abnormality. IMPRESSION: COPD/chronic changes.  No active disease. Electronically Signed   By: Charlett Nose M.D.   On: 12/12/2017 18:25     ASSESSMENT AND PLAN:  72 year old male patient with history of CAD, stent placement, diabetes mellitus, hypertension, hyperlipidemia currently under hospitalist service for non-STEMI  -NSTEMI Status post cardiac catheterization PCI and stent placement in the in-stent restenosis of proximal right coronary artery Dual antiplatelet therapy with high intensity statin Tele monitoring Follow-up echocardiogram ACE inhibitor and  beta-blocker Cardiac rehabilitation  -  HTN ACE inhibitor and beta-blocker  - Insulin-dependent diabetes mellitus.  Continued on long-acting  insulin.  Sliding scale insulin.  Diabetic diet  DVT prophylaxis.   [place] lovenox will be started  All the records are reviewed and case discussed with Care Management/Social Workerr. Management plans discussed with the patient, family and they are in agreement.  CODE STATUS: FULL CODE  TOTAL TIME TAKING CARE OF THIS PATIENT: 33 minutes.   POSSIBLE D/C IN 1-2 DAYS, DEPENDING ON CLINICAL CONDITION.  Ihor Austin M.D on 12/14/2017 at 1:40 PM  Between 7am to 6pm - Pager - [phone]  After 6pm go to [email or web address] - password [email or web address] [facility]  [facility] [place] [facility]  Office  [phone]  CC: Primary care physician; Lauro Regulus, MD  Note: This dictation was prepared with Dragon dictation along with smaller phrase technology. Any transcriptional errors that result from this process are unintentional.

## 2017-12-14 NOTE — Progress Notes (Signed)
[facility] Cardiology [facility] Encounter Note  Patient: Gregory Mendoza / Admit Date: 12/12/2017 / Date of Encounter: 12/14/2017, 10:53 AM   Subjective: No further chest pain overnight.  Patient stabilized with appropriate medication management.  Troponin elevation consistent with non-ST elevation myocardial infarction Cardiac catheterization showing mild LV systolic dysfunction with inferior akinesis consistent with previous myocardial infarction with ejection fraction of 45% Restenosis of proximal right coronary artery stent of 95% Moderate atherosclerosis of distal right coronary artery proximal left anterior descending artery and proximal left circumflex artery  Review of Systems: Positive for: None Negative for: Vision change, hearing change, syncope, dizziness, nausea, vomiting,diarrhea, bloody stool, stomach pain, cough, congestion, diaphoresis, urinary frequency, urinary pain,skin lesions, skin rashes Others previously listed  Objective: Telemetry: Sinus rhythm Physical Exam: Blood pressure (!) 165/77, pulse (!) 103, temperature 98.1 F (36.7 C), temperature source Oral, resp. rate 13, height 5\' 6"  (1.676 m), weight 72.8 kg, SpO2 95 %. Body mass index is 25.9 kg/m. General: Well developed, well nourished, in no acute distress. Head: Normocephalic, atraumatic, sclera non-icteric, no xanthomas, nares are without discharge. Neck: No apparent masses Lungs: Normal respirations with no wheezes, no rhonchi, no rales , no crackles   Heart: Regular rate and rhythm, normal S1 S2, no murmur, no rub, no gallop, PMI is normal size and placement, carotid upstroke normal without bruit, jugular venous pressure normal Abdomen: Soft, non-tender, non-distended with normoactive bowel sounds. No hepatosplenomegaly. Abdominal aorta is normal size without bruit Extremities: No edema, no clubbing, no cyanosis, no ulcers,  Peripheral: 2+ radial, 2+ femoral, 2+ dorsal pedal pulses Neuro: Alert and  oriented. Moves all extremities spontaneously. Psych:  Responds to questions appropriately with a normal affect.   Intake/Output Summary (Last 24 hours) at 12/14/2017 1053 Last data filed at 12/13/2017 2248 Gross per 24 hour  Intake 111 ml  Output 900 ml  Net -789 ml    Inpatient Medications:  . [MAR Hold] aspirin  81 mg Oral Daily  . [MAR Hold] atorvastatin  10 mg Oral Daily  . [MAR Hold] diclofenac sodium  4 g Topical QID  . [MAR Hold] gabapentin  600 mg Oral TID  . [MAR Hold] glimepiride  4 mg Oral Q breakfast  . [MAR Hold] insulin detemir  15 Units Subcutaneous BID  . [MAR Hold] isosorbide mononitrate  30 mg Oral Daily  . [MAR Hold] lisinopril  10 mg Oral Daily  . [MAR Hold] metoprolol tartrate  50 mg Oral BID  . [MAR Hold] multivitamin with minerals  1 tablet Oral Daily  . [MAR Hold] pioglitazone  30 mg Oral Daily  . sodium chloride flush  3 mL Intravenous Q12H   Infusions:  . sodium chloride    . sodium chloride 1 mL/kg/hr (12/14/17 0707)  . heparin Stopped (12/14/17 0917)    Labs: Recent Labs    12/12/17 1724  NA 140  K 4.2  CL 104  CO2 28  GLUCOSE 237*  BUN 19  CREATININE 0.83  CALCIUM 9.0   No results for input(s): AST, ALT, ALKPHOS, BILITOT, PROT, ALBUMIN in the last 72 hours. Recent Labs    12/12/17 1724 12/14/17 0417  WBC 7.9 8.6  HGB 13.9 14.6  HCT 42.7 44.9  MCV 93.4 93.2  PLT 341 367   Recent Labs    12/12/17 1724 12/12/17 2047 12/13/17 0115 12/13/17 0353  TROPONINI <0.03 0.23* 0.29* 0.22*   Invalid input(s): POCBNP No results for input(s): HGBA1C in the last 72 hours.   Weights: [employer]  12/12/17 2033 12/14/17 0426 12/14/17 0915  Weight: 74.7 kg 72.8 kg 72.8 kg     Radiology/Studies:  Dg Chest 2 View  Result Date: 12/12/2017 CLINICAL DATA:  Chest pain EXAM: CHEST - 2 VIEW COMPARISON:  10/27/2006 FINDINGS: Calcified granuloma posteriorly in the left lower lobe, stable. There is hyperinflation of the lungs compatible  with COPD. Linear areas of scarring in the lower lungs bilaterally. No confluent airspace opacities or effusions. No acute bony abnormality. IMPRESSION: COPD/chronic changes.  No active disease. Electronically Signed   By: Charlett Nose M.D.   On: 12/12/2017 18:25     Assessment and Recommendation  72 y.o. male with known coronary artery disease status post previous and 4 infarct PCI and stent placement diabetes hypertension hyperlipidemia with a non-ST elevation myocardial infarction Catheterization with mild LV systolic dysfunction with ejection fraction of 45% In-stent restenosis of proximal right coronary artery stent causing symptoms and culprit artery 1.  PCI and stent placement of in-stent restenosis of proximal right coronary artery 2.  Dual antiplatelet therapy 3.  High intensity cholesterol therapy 4.  ACE inhibitor for myocardial infarction and continue beta-blocker due to previously concerns of bradycardia 5.  Cardiac rehabilitation  Signed, Arnoldo Hooker M.D. FACC

## 2017-12-14 NOTE — Progress Notes (Signed)
ANTICOAGULATION CONSULT NOTE -   Pharmacy Consult for heparin drip Indication: chest pain/ACS  No Known Allergies  Patient Measurements: Height: 5\' 6"  (167.6 cm) Weight: 160 lb 8 oz (72.8 kg) IBW/kg (Calculated) : 63.8 Heparin Dosing Weight: 75 kg  Vital Signs: Temp: 98.4 F (36.9 C) (10/21 0426) Temp Source: Oral (10/21 0426) BP: 143/71 (10/21 0426) Pulse Rate: 85 (10/21 0426)  Labs: Recent Labs    12/12/17 1724 12/12/17 2047 12/13/17 0115 12/13/17 0353 12/13/17 1155 12/13/17 2026 12/14/17 0417  HGB 13.9  --   --   --   --   --  14.6  HCT 42.7  --   --   --   --   --  44.9  PLT 341  --   --   --   --   --  367  APTT  --   --   --  46*  --   --   --   LABPROT  --   --   --  12.9  --   --   --   INR  --   --   --  0.98  --   --   --   HEPARINUNFRC  --   --   --   --  0.41 0.37 0.29*  CREATININE 0.83  --   --   --   --   --   --   TROPONINI <0.03 0.23* 0.29* 0.22*  --   --   --     Estimated Creatinine Clearance: 71.5 mL/min (by C-G formula based on SCr of 0.83 mg/dL).   Medical History: Past Medical History:  Diagnosis Date  . Chronic back pain   . Diabetes mellitus without complication (HCC)   . Hyperlipidemia   . Hypertension   . Myocardial infarction [facility]) 2007, 2009  . Peripheral neuropathy     Medications:  No anticoag in PTA meds  Assessment: Trop 0.29   Goal of Therapy:  Heparin level 0.3-0.7 units/ml Monitor platelets by anticoagulation protocol: Yes   Plan:  10/20 20:00 Heparin level = 0.37 is within range, continue with current rate of 900 units/hr. Next Heparin level with AM labs. Daily CBC while on Heparin drip.  10/21 AM heparin level 0.29. 1100 unit bolus and increase rate to 1050 units/hr. Recheck in 8 hours.  Fulton Reek, PharmD, BCPS  12/14/17 6:16 AM

## 2017-12-15 ENCOUNTER — Encounter: Payer: Self-pay | Admitting: Internal Medicine

## 2017-12-15 LAB — CBC
HCT: 43.3 % (ref 39.0–52.0)
HEMOGLOBIN: 14.2 g/dL (ref 13.0–17.0)
MCH: 30.3 pg (ref 26.0–34.0)
MCHC: 32.8 g/dL (ref 30.0–36.0)
MCV: 92.3 fL (ref 80.0–100.0)
PLATELETS: 368 10*3/uL (ref 150–400)
RBC: 4.69 MIL/uL (ref 4.22–5.81)
RDW: 13.2 % (ref 11.5–15.5)
WBC: 9.9 10*3/uL (ref 4.0–10.5)
nRBC: 0 % (ref 0.0–0.2)

## 2017-12-15 LAB — BASIC METABOLIC PANEL
Anion gap: 9 (ref 5–15)
BUN: 16 mg/dL (ref 8–23)
CO2: 27 mmol/L (ref 22–32)
Calcium: 9.2 mg/dL (ref 8.9–10.3)
Chloride: 104 mmol/L (ref 98–111)
Creatinine, Ser: 0.76 mg/dL (ref 0.61–1.24)
Glucose, Bld: 116 mg/dL — ABNORMAL HIGH (ref 70–99)
POTASSIUM: 3.9 mmol/L (ref 3.5–5.1)
SODIUM: 140 mmol/L (ref 135–145)

## 2017-12-15 LAB — GLUCOSE, CAPILLARY
GLUCOSE-CAPILLARY: 184 mg/dL — AB (ref 70–99)
Glucose-Capillary: 172 mg/dL — ABNORMAL HIGH (ref 70–99)

## 2017-12-15 MED ORDER — TICAGRELOR 90 MG PO TABS: 90.0000 mg | ORAL_TABLET | Freq: Two times a day (BID) | ORAL | 0 refills | Status: AC

## 2017-12-15 MED ORDER — ATORVASTATIN CALCIUM 80 MG PO TABS: 80.0000 mg | ORAL_TABLET | Freq: Every day | ORAL | 0 refills | Status: DC

## 2017-12-15 NOTE — Discharge Summary (Signed)
[facility] - [place] at [facility]   PATIENT NAME: Gregory Mendoza    MR#:  [id number]  DATE OF BIRTH:  08/25/1945  DATE OF ADMISSION:  12/12/2017 ADMITTING PHYSICIAN: Ramonita Lab, MD  DATE OF DISCHARGE: 12/15/2017  PRIMARY CARE PHYSICIAN: Lauro Regulus, MD   ADMISSION DIAGNOSIS:  Chest pain, unspecified type [R07.9] Type 2 diabetes mellitus Hyperlipidemia Hypertension DISCHARGE DIAGNOSIS:  Active Problems:   Chest pain   NSTEMI (non-ST elevated myocardial infarction) [facility]) Coronary artery disease Status post PCI and stent placement proximal right coronary artery for the in-stent restenosis Hypertension Hyperlipidemia Type 2 diabetes mellitus  SECONDARY DIAGNOSIS:   Past Medical History:  Diagnosis Date  . Chronic back pain   . Diabetes mellitus without complication (HCC)   . Hyperlipidemia   . Hypertension   . Myocardial infarction [facility]) 2007, 2009  . Peripheral neuropathy      ADMITTING HISTORY Gregory Mendoza  is a 72 y.o. male with a known history of urinary artery disease with an MI 10 years ago status post 3 stents, hypertension, insulin requiring diabetes metas, hyperlipidemia and chronic low back pain is presented to the ED after he experienced chest pain radiating to the right jaw with pressure-like sensation in the chest associated with some sweating.  Patient took one sublingual nitroglycerin with no improvement and came into the emergency department.  Initial troponin is negative chest x-ray negative and EKG with sinus tachycardia at 103 bpm.  During my examination patient is chest pain-free and resting comfortably.   HOSPITAL COURSE:  And was admitted to telemetry.  Was evaluated by cardiology.  Patient was put on aspirin, statin medication along with beta-blocker and ACE inhibitor.  He was worked up with echocardiogram.  During the hospitalization patient's troponins were significantly elevated.  He was anticoagulated with IV heparin drip.   Cardiology intervention was done patient had a cardiac catheterization.  He had in-stent restenosis which was again restented.  Patient continued aspirin, Brilinta and high intensity statin.  He will be discharged home on ACE inhibitor and beta-blocker and all the cardiac medications and follow-up with cardiology in the clinic.  CONSULTS OBTAINED:  Treatment Team:  Lamar Blinks, MD  DRUG ALLERGIES:  No Known Allergies  DISCHARGE MEDICATIONS:   Allergies as of 12/15/2017   No Known Allergies     Medication List    TAKE these medications   acetaminophen 500 MG tablet Commonly known as:  TYLENOL Take 500-1,000 mg by mouth every 6 (six) hours as needed for pain.   aspirin 81 MG chewable tablet Chew 81 mg by mouth daily.   atorvastatin 80 MG tablet Commonly known as:  LIPITOR Take 1 tablet (80 mg total) by mouth daily. Start taking on:  12/16/2017 What changed:    medication strength  how much to take   diclofenac sodium 1 % Gel Commonly known as:  VOLTAREN Apply 4 g topically 4 (four) times daily.   gabapentin 600 MG tablet Commonly known as:  NEURONTIN Take 600 mg by mouth 3 (three) times daily.   glimepiride 4 MG tablet Commonly known as:  AMARYL Take 4 mg by mouth daily with breakfast.   insulin NPH Human 100 UNIT/ML injection Commonly known as:  HUMULIN N,NOVOLIN N Inject 15 Units into the skin 2 (two) times daily before a meal.   isosorbide mononitrate 30 MG 24 hr tablet Commonly known as:  IMDUR Take 30 mg by mouth daily.   lisinopril 10 MG tablet Commonly known as:  PRINIVIL,ZESTRIL Take 10 mg by mouth daily.   metFORMIN 1000 MG tablet Commonly known as:  GLUCOPHAGE Take 1,000 mg by mouth 2 (two) times daily with a meal.   metoprolol tartrate 50 MG tablet Commonly known as:  LOPRESSOR Take 50 mg by mouth 2 (two) times daily.   MULTIVITAMIN ADULT Tabs Take 1 tablet by mouth daily.   pioglitazone 30 MG tablet Commonly known as:  ACTOS Take  30 mg by mouth daily.   REDNESS RELIEVER EYE DROPS 0.05 % ophthalmic solution Generic drug:  tetrahydrozoline Place 1 drop into both eyes as directed.   ticagrelor 90 MG Tabs tablet Commonly known as:  BRILINTA Take 1 tablet (90 mg total) by mouth 2 (two) times daily.       Today  Seen and evaluated in the day of discharge No chest pain Shortness of breath  VITAL SIGNS:  Blood pressure (!) 153/84, pulse 93, temperature 97.8 F (36.6 C), temperature source Oral, resp. rate 18, height 5\' 6"  (1.676 m), weight 72.8 kg, SpO2 96 %.  I/O:    Intake/Output Summary (Last 24 hours) at 12/15/2017 1319 Last data filed at 12/15/2017 1002 Gross per 24 hour  Intake 1327.87 ml  Output 1355 ml  Net -27.13 ml    PHYSICAL EXAMINATION:  Physical Exam  GENERAL:  72 y.o.-year-old patient lying in the bed with no acute distress.  LUNGS: Normal breath sounds bilaterally, no wheezing, rales,rhonchi or crepitation. No use of accessory muscles of respiration.  CARDIOVASCULAR: S1, S2 normal. No murmurs, rubs, or gallops.  ABDOMEN: Soft, non-tender, non-distended. Bowel sounds present. No organomegaly or mass.  NEUROLOGIC: Moves all 4 extremities. PSYCHIATRIC: The patient is alert and oriented x 3.  SKIN: No obvious rash, lesion, or ulcer.   DATA REVIEW:   CBC Recent Labs  Lab 12/15/17 0402  WBC 9.9  HGB 14.2  HCT 43.3  PLT 368    Chemistries  Recent Labs  Lab 12/15/17 0402  NA 140  K 3.9  CL 104  CO2 27  GLUCOSE 116*  BUN 16  CREATININE 0.76  CALCIUM 9.2    Cardiac Enzymes Recent Labs  Lab 12/13/17 0353  TROPONINI 0.22*    Microbiology Results  No results found for this or any previous visit.  RADIOLOGY:  No results found.  Follow up with PCP in 1 week.  Management plans discussed with the patient, family and they are in agreement.  CODE STATUS:     Code Status Orders  (From admission, onward)         Start     Ordered   12/12/17 1919  Full code   Continuous     12/12/17 1919        Code Status History    This patient has a current code status but no historical code status.    Advance Directive Documentation     Most Recent Value  Type of Advance Directive  Healthcare Power of Attorney, Living will  Pre-existing out of facility DNR order (yellow form or pink MOST form)  -  "MOST" Form in Place?  -      TOTAL TIME TAKING CARE OF THIS PATIENT ON DAY OF DISCHARGE: more than 34 minutes.   Ihor Austin M.D on 12/15/2017 at 1:19 PM  Between 7am to 6pm - Pager - [phone]  After 6pm go to [email or web address] - password [email or web address] [facility]  [facility] [place] [facility]  Office  [phone]  CC: Primary care physician; Lauro Regulus, MD  Note:  This dictation was prepared with Dragon dictation along with smaller phrase technology. Any transcriptional errors that result from this process are unintentional.

## 2017-12-15 NOTE — Care Management Note (Signed)
Case Management Note  Patient Details  Name: Gregory Mendoza MRN: [id number] Date of Birth: January 01, 1946  Subjective/Objective:     Independent in all adls, denies issues accessing medical care, obtaining medications or with transportation.  Current with PCP.  No discharge needs identified at present by care manager or members of care team                 Action/Plan:   Expected Discharge Date:  12/15/17               Expected Discharge Plan:  Home/Self Care  In-House Referral:     Discharge planning Services  CM Consult  Post Acute Care Choice:    Choice offered to:     DME Arranged:    DME Agency:     HH Arranged:    HH Agency:     Status of Service:  Completed, signed off  If discussed at [employer] of Stay Meetings, dates discussed:    Additional Comments:  Sherren Kerns, RN 12/15/2017, 10:24 AM

## 2017-12-15 NOTE — Care Management (Signed)
Brilinta coupon given to patient for 30 days free.

## 2017-12-15 NOTE — Progress Notes (Signed)
[facility] Encounter Note  Patient: Gregory Mendoza / Admit Date: 12/12/2017 / Date of Encounter: 12/15/2017, 8:23 AM   Subjective: No further chest pain overnight.  Patient stabilized with appropriate medication management.  Patient feeling much better after right coronary artery intervention with significant atherosclerosis and restent.  Troponin elevation consistent with non-ST elevation myocardial infarction Cardiac catheterization showing mild LV systolic dysfunction with inferior akinesis consistent with previous myocardial infarction with ejection fraction of 45% Restenosis of proximal right coronary artery stent of 95% Moderate atherosclerosis of distal right coronary artery proximal left anterior descending artery and proximal left circumflex artery  Review of Systems: Positive for: None Negative for: Vision change, hearing change, syncope, dizziness, nausea, vomiting,diarrhea, bloody stool, stomach pain, cough, congestion, diaphoresis, urinary frequency, urinary pain,skin lesions, skin rashes Others previously listed  Objective: Telemetry: Sinus rhythm Physical Exam: Blood pressure (!) 153/84, pulse 93, temperature 97.8 F (36.6 C), temperature source Oral, resp. rate 18, height 5\' 6"  (1.676 m), weight 72.8 kg, SpO2 96 %. Body mass index is 25.9 kg/m. General: Well developed, well nourished, in no acute distress. Head: Normocephalic, atraumatic, sclera non-icteric, no xanthomas, nares are without discharge. Neck: No apparent masses Lungs: Normal respirations with no wheezes, no rhonchi, no rales , no crackles   Heart: Regular rate and rhythm, normal S1 S2, no murmur, no rub, no gallop, PMI is normal size and placement, carotid upstroke normal without bruit, jugular venous pressure normal Abdomen: Soft, non-tender, non-distended with normoactive bowel sounds. No hepatosplenomegaly. Abdominal aorta is normal size without bruit Extremities: No edema, no  clubbing, no cyanosis, no ulcers,  Peripheral: 2+ radial, 2+ femoral, 2+ dorsal pedal pulses Neuro: Alert and oriented. Moves all extremities spontaneously. Psych:  Responds to questions appropriately with a normal affect.   Intake/Output Summary (Last 24 hours) at 12/15/2017 0823 Last data filed at 12/15/2017 0454 Gross per 24 hour  Intake 1105.87 ml  Output 1705 ml  Net -599.13 ml    Inpatient Medications:  . aspirin  81 mg Oral Daily  . aspirin  81 mg Oral Daily  . atorvastatin  80 mg Oral Daily  . diclofenac sodium  4 g Topical QID  . enoxaparin (LOVENOX) injection  40 mg Subcutaneous Q24H  . gabapentin  600 mg Oral TID  . glimepiride  4 mg Oral Q breakfast  . insulin aspart  0-5 Units Subcutaneous QHS  . insulin aspart  0-9 Units Subcutaneous TID WC  . insulin detemir  15 Units Subcutaneous BID  . isosorbide mononitrate  30 mg Oral Daily  . lisinopril  10 mg Oral Daily  . metoprolol tartrate  50 mg Oral BID  . multivitamin with minerals  1 tablet Oral Daily  . pioglitazone  30 mg Oral Daily  . sodium chloride flush  3 mL Intravenous Q12H  . ticagrelor  90 mg Oral BID   Infusions:  . sodium chloride      Labs: Recent Labs    12/12/17 1724  NA 140  K 4.2  CL 104  CO2 28  GLUCOSE 237*  BUN 19  CREATININE 0.83  CALCIUM 9.0   No results for input(s): AST, ALT, ALKPHOS, BILITOT, PROT, ALBUMIN in the last 72 hours. Recent Labs    12/14/17 0417 12/15/17 0402  WBC 8.6 9.9  HGB 14.6 14.2  HCT 44.9 43.3  MCV 93.2 92.3  PLT 367 368   Recent Labs    12/12/17 1724 12/12/17 2047 12/13/17 0115 12/13/17 0353  TROPONINI <0.03 0.23*  0.29* 0.22*   Invalid input(s): POCBNP No results for input(s): HGBA1C in the last 72 hours.   Weights: Filed Weights   12/12/17 2033 12/14/17 0426 12/14/17 0915  Weight: 74.7 kg 72.8 kg 72.8 kg     Radiology/Studies:  Dg Chest 2 View  Result Date: 12/12/2017 CLINICAL DATA:  Chest pain EXAM: CHEST - 2 VIEW COMPARISON:   10/27/2006 FINDINGS: Calcified granuloma posteriorly in the left lower lobe, stable. There is hyperinflation of the lungs compatible with COPD. Linear areas of scarring in the lower lungs bilaterally. No confluent airspace opacities or effusions. No acute bony abnormality. IMPRESSION: COPD/chronic changes.  No active disease. Electronically Signed   By: Charlett Nose M.D.   On: 12/12/2017 18:25     Assessment and Recommendation  72 y.o. male with known coronary artery disease status post previous and 4 infarct PCI and stent placement diabetes hypertension hyperlipidemia with a non-ST elevation myocardial infarction Catheterization with mild LV systolic dysfunction with ejection fraction of 45% In-stent restenosis of proximal right coronary artery stent causing symptoms and culprit artery Successful PCI and stent placement of proximal right coronary artery in stent due to in-stent restenosis and distal artery stent due to worsening and new atherosclerotic lesion 1.  Begin ambulation and follow for any further significant symptoms or issues status post PCI and stent placement 2.  Dual antiplatelet therapy 3.  High intensity cholesterol therapy 4.  ACE inhibitor for myocardial infarction and continue beta-blocker due to previously concerns of bradycardia 5.  Cardiac rehabilitation 6.  Okay for discharge home from cardiac standpoint with follow-up next week  Signed, Arnoldo Hooker M.D. FACC

## 2017-12-15 NOTE — Plan of Care (Signed)

## 2017-12-15 NOTE — Plan of Care (Signed)
  Problem: Clinical Measurements: Goal: Ability to maintain clinical measurements within normal limits will improve Outcome: Progressing Goal: Cardiovascular complication will be avoided Outcome: Progressing   Problem: Activity: Goal: Risk for activity intolerance will decrease Outcome: Progressing   

## 2017-12-17 ENCOUNTER — Other Ambulatory Visit: Payer: Self-pay

## 2017-12-17 ENCOUNTER — Emergency Department: Payer: Medicare HMO

## 2017-12-17 ENCOUNTER — Emergency Department
Admission: EM | Admit: 2017-12-17 | Discharge: 2017-12-17 | Disposition: A | Discharge status: Home / Self Care | Payer: Medicare HMO | Source: Home / Self Care | Attending: Emergency Medicine | Admitting: Emergency Medicine

## 2017-12-17 ENCOUNTER — Encounter: Payer: Self-pay | Admitting: Emergency Medicine

## 2017-12-17 ENCOUNTER — Inpatient Hospital Stay
Admission: EM | Admit: 2017-12-17 | Discharge: 2017-12-20 | DRG: 228 | Disposition: A | Discharge status: Home / Self Care | Payer: Medicare HMO | Attending: Family Medicine | Admitting: Family Medicine

## 2017-12-17 DIAGNOSIS — K5641 Fecal impaction: Secondary | ICD-10-CM

## 2017-12-17 DIAGNOSIS — Y838 Other surgical procedures as the cause of abnormal reaction of the patient, or of later complication, without mention of misadventure at the time of the procedure: Secondary | ICD-10-CM | POA: Diagnosis present

## 2017-12-17 DIAGNOSIS — Z7902 Long term (current) use of antithrombotics/antiplatelets: Secondary | ICD-10-CM | POA: Diagnosis not present

## 2017-12-17 DIAGNOSIS — Z87891 Personal history of nicotine dependence: Secondary | ICD-10-CM | POA: Diagnosis not present

## 2017-12-17 DIAGNOSIS — I2111 ST elevation (STEMI) myocardial infarction involving right coronary artery: Secondary | ICD-10-CM | POA: Diagnosis present

## 2017-12-17 DIAGNOSIS — I213 ST elevation (STEMI) myocardial infarction of unspecified site: Secondary | ICD-10-CM | POA: Diagnosis not present

## 2017-12-17 DIAGNOSIS — E119 Type 2 diabetes mellitus without complications: Secondary | ICD-10-CM

## 2017-12-17 DIAGNOSIS — I2129 ST elevation (STEMI) myocardial infarction involving other sites: Secondary | ICD-10-CM | POA: Diagnosis present

## 2017-12-17 DIAGNOSIS — G629 Polyneuropathy, unspecified: Secondary | ICD-10-CM | POA: Diagnosis present

## 2017-12-17 DIAGNOSIS — Z79899 Other long term (current) drug therapy: Secondary | ICD-10-CM

## 2017-12-17 DIAGNOSIS — I10 Essential (primary) hypertension: Secondary | ICD-10-CM | POA: Diagnosis present

## 2017-12-17 DIAGNOSIS — Z7982 Long term (current) use of aspirin: Secondary | ICD-10-CM

## 2017-12-17 DIAGNOSIS — Z955 Presence of coronary angioplasty implant and graft: Secondary | ICD-10-CM | POA: Diagnosis not present

## 2017-12-17 DIAGNOSIS — E785 Hyperlipidemia, unspecified: Secondary | ICD-10-CM | POA: Diagnosis present

## 2017-12-17 DIAGNOSIS — Z9119 Patient's noncompliance with other medical treatment and regimen: Secondary | ICD-10-CM | POA: Diagnosis not present

## 2017-12-17 DIAGNOSIS — Z8249 Family history of ischemic heart disease and other diseases of the circulatory system: Secondary | ICD-10-CM

## 2017-12-17 DIAGNOSIS — T82855A Stenosis of coronary artery stent, initial encounter: Principal | ICD-10-CM | POA: Diagnosis present

## 2017-12-17 DIAGNOSIS — Z794 Long term (current) use of insulin: Secondary | ICD-10-CM

## 2017-12-17 DIAGNOSIS — R079 Chest pain, unspecified: Secondary | ICD-10-CM | POA: Diagnosis not present

## 2017-12-17 DIAGNOSIS — I252 Old myocardial infarction: Secondary | ICD-10-CM | POA: Diagnosis not present

## 2017-12-17 DIAGNOSIS — I2102 ST elevation (STEMI) myocardial infarction involving left anterior descending coronary artery: Secondary | ICD-10-CM | POA: Diagnosis not present

## 2017-12-17 DIAGNOSIS — J449 Chronic obstructive pulmonary disease, unspecified: Secondary | ICD-10-CM | POA: Diagnosis present

## 2017-12-17 DIAGNOSIS — I228 Subsequent ST elevation (STEMI) myocardial infarction of other sites: Secondary | ICD-10-CM | POA: Diagnosis present

## 2017-12-17 DIAGNOSIS — K5900 Constipation, unspecified: Secondary | ICD-10-CM

## 2017-12-17 LAB — BASIC METABOLIC PANEL
ANION GAP: 9 (ref 5–15)
BUN: 24 mg/dL — ABNORMAL HIGH (ref 8–23)
CALCIUM: 9.6 mg/dL (ref 8.9–10.3)
CO2: 24 mmol/L (ref 22–32)
Chloride: 106 mmol/L (ref 98–111)
Creatinine, Ser: 0.87 mg/dL (ref 0.61–1.24)
Glucose, Bld: 164 mg/dL — ABNORMAL HIGH (ref 70–99)
Potassium: 4.1 mmol/L (ref 3.5–5.1)
Sodium: 139 mmol/L (ref 135–145)

## 2017-12-17 LAB — CBC
HCT: 42.4 % (ref 39.0–52.0)
Hemoglobin: 13.9 g/dL (ref 13.0–17.0)
MCH: 30.3 pg (ref 26.0–34.0)
MCHC: 32.8 g/dL (ref 30.0–36.0)
MCV: 92.4 fL (ref 80.0–100.0)
NRBC: 0 % (ref 0.0–0.2)
PLATELETS: 425 10*3/uL — AB (ref 150–400)
RBC: 4.59 MIL/uL (ref 4.22–5.81)
RDW: 13.2 % (ref 11.5–15.5)
WBC: 12.4 10*3/uL — ABNORMAL HIGH (ref 4.0–10.5)

## 2017-12-17 LAB — TROPONIN I: TROPONIN I: 0.16 ng/mL — AB (ref <0.03)

## 2017-12-17 MED ORDER — NITROGLYCERIN IN D5W 200-5 MCG/ML-% IV SOLN
0.0000 ug/min | Freq: Once | INTRAVENOUS | Status: AC
  Administered 2017-12-17: 10 ug/min via INTRAVENOUS

## 2017-12-17 MED ORDER — ASPIRIN 81 MG PO CHEW
324.0000 mg | CHEWABLE_TABLET | Freq: Once | ORAL | Status: AC
  Administered 2017-12-17: 324 mg via ORAL
  Filled 2017-12-17: qty 4

## 2017-12-17 MED ORDER — FENTANYL CITRATE (PF) 100 MCG/2ML IJ SOLN
75.0000 ug | Freq: Once | INTRAMUSCULAR | Status: AC
  Administered 2017-12-17: 75 ug via INTRAVENOUS

## 2017-12-17 MED ORDER — HEPARIN SODIUM (PORCINE) 5000 UNIT/ML IJ SOLN
4000.0000 [IU] | Freq: Once | INTRAMUSCULAR | Status: AC
  Administered 2017-12-17: 4000 [IU] via INTRAVENOUS

## 2017-12-17 MED ORDER — NITROGLYCERIN IN D5W 200-5 MCG/ML-% IV SOLN
INTRAVENOUS | Status: AC
  Administered 2017-12-17: 10 ug/min via INTRAVENOUS
  Filled 2017-12-17: qty 250

## 2017-12-17 MED ORDER — FENTANYL CITRATE (PF) 100 MCG/2ML IJ SOLN
INTRAMUSCULAR | Status: AC
  Administered 2017-12-17: 75 ug via INTRAVENOUS
  Filled 2017-12-17: qty 2

## 2017-12-17 NOTE — ED Triage Notes (Signed)
Constipation.  Unable to have bm since Tuesday.  Just got out of hospital after stents put in.

## 2017-12-17 NOTE — ED Notes (Addendum)
Trop 0.16 Dr. Lamont Snowball made aware.

## 2017-12-17 NOTE — ED Triage Notes (Signed)
Patient c/o medial chest pain radiating to neck/jaw. Patient c/o diaphoresis. Patient seen in this ED on Tuesday for same, had 2 stents placed.

## 2017-12-17 NOTE — ED Notes (Signed)
Patient reports two large BMs

## 2017-12-17 NOTE — ED Notes (Addendum)
Pt had 2 stents placed on Monday. HX of MI 10 years ago. Pt stated that tonight he started having chest pain about 2215.

## 2017-12-17 NOTE — ED Notes (Signed)
RIGHT SIDED AND POSTERIOR EKG PERFORMED.

## 2017-12-17 NOTE — Discharge Instructions (Addendum)
Return to the emergency room immediately for any worsening condition including abdominal pain, black or bloody stools, or inability to have bowel movement.

## 2017-12-17 NOTE — ED Provider Notes (Signed)
[facility] Emergency Department Provider Note ____________________________________________   I have reviewed the triage vital signs and the triage nursing note.  HISTORY  Chief Complaint Constipation   Historian Patient and wife  HPI Gregory Mendoza is a 72 y.o. male presenting with a complaint of constipation and rectal pain and inability to have bowel movement.  Typically has bowel movements twice per day, has not had a bowel movement in about 5 days now.  He has a feeling that he needs to defecate.  He has pain in his rectum.  No black or bloody stools.  Mild nausea without vomiting.  No chest pain or trouble breathing or shortness of breath.  No fevers.  Pain at the rectum and sensation of fullness is moderate to severe.  He is having trouble walking and sitting due to pain.     Past Medical History:  Diagnosis Date  . Chronic back pain   . Diabetes mellitus without complication (HCC)   . Hyperlipidemia   . Hypertension   . Myocardial infarction [facility] 2007, 2009  . Peripheral neuropathy     Patient Active Problem List   Diagnosis Date Noted  . NSTEMI (non-ST elevated myocardial infarction) (HCC) 12/13/2017  . Chest pain 12/12/2017  . Groin pain, right 03/04/2016    Past Surgical History:  Procedure Laterality Date  . APPENDECTOMY  1970's  . BACK SURGERY  1997  . COLONOSCOPY  2017  . CORONARY STENT INTERVENTION N/A 12/14/2017   Procedure: CORONARY STENT INTERVENTION;  Surgeon: Alwyn Pea, MD;  Location: [facility] INVASIVE CV LAB;  Service: Cardiovascular;  Laterality: N/A;  . HERNIA REPAIR Left 02/08/2008   Dr Lemar Livings  . INGUINAL HERNIA REPAIR Right 1999   with mesh while in [place]  . LEFT HEART CATH AND CORONARY ANGIOGRAPHY N/A 12/14/2017   Procedure: LEFT HEART CATH AND CORONARY ANGIOGRAPHY;  Surgeon: Lamar Blinks, MD;  Location: [facility] INVASIVE CV LAB;  Service: Cardiovascular;  Laterality: N/A;    Prior to Admission  medications   Medication Sig Start Date End Date Taking? Authorizing Provider  acetaminophen (TYLENOL) 500 MG tablet Take 500-1,000 mg by mouth every 6 (six) hours as needed for pain. 12/04/17   [provider]  aspirin 81 MG chewable tablet Chew 81 mg by mouth daily.     [provider]  atorvastatin (LIPITOR) 80 MG tablet Take 1 tablet (80 mg total) by mouth daily. 12/16/17 01/15/18  Ihor Austin, MD  diclofenac sodium (VOLTAREN) 1 % GEL Apply 4 g topically 4 (four) times daily.    [provider]  gabapentin (NEURONTIN) 600 MG tablet Take 600 mg by mouth 3 (three) times daily.     [provider]  glimepiride (AMARYL) 4 MG tablet Take 4 mg by mouth daily with breakfast.     [provider]  insulin NPH Human (HUMULIN N,NOVOLIN N) 100 UNIT/ML injection Inject 15 Units into the skin 2 (two) times daily before a meal.    [provider]  isosorbide mononitrate (IMDUR) 30 MG 24 hr tablet Take 30 mg by mouth daily.    [provider]  lisinopril (PRINIVIL,ZESTRIL) 10 MG tablet Take 10 mg by mouth daily.     [provider]  metFORMIN (GLUCOPHAGE) 1000 MG tablet Take 1,000 mg by mouth 2 (two) times daily with a meal.     [provider]  metoprolol (LOPRESSOR) 50 MG tablet Take 50 mg by mouth 2 (two) times daily.     [provider]  Multiple Vitamins-Minerals (MULTIVITAMIN ADULT) TABS Take 1 tablet by mouth daily. 12/04/17   [provider]  pioglitazone (ACTOS) 30 MG tablet Take 30 mg by mouth daily.     [provider]  REDNESS RELIEVER EYE DROPS 0.05 % ophthalmic solution Place 1 drop into both eyes as directed. 12/04/17   [provider]  ticagrelor (BRILINTA) 90 MG TABS tablet Take 1 tablet (90 mg total) by mouth 2 (two) times daily. 12/15/17 01/14/18  Ihor Austin, MD    No Known Allergies  No family history on file.  Social History Social History   Tobacco Use  .  Smoking status: Former Smoker    Years: 40.00    Last attempt to quit: 02/24/1998    Years since quitting: 19.8  . Smokeless tobacco: Never Used  Substance Use Topics  . Alcohol use: No  . Drug use: No    Review of Systems  Constitutional: Negative for fever. Eyes: Negative for visual changes. ENT: Negative for sore throat. Cardiovascular: Negative for chest pain. Respiratory: Negative for shortness of breath. Gastrointestinal: Negative for vomiting. Genitourinary: Negative for dysuria. Musculoskeletal: Negative for back pain. Skin: Negative for rash. Neurological: Negative for headache.  ____________________________________________   PHYSICAL EXAM:  VITAL SIGNS: ED Triage Vitals [12/17/17 0801]  Enc Vitals Group     BP      Pulse      Resp      Temp      Temp src      SpO2      Weight 160 lb 7.9 oz (72.8 kg)     Height 5\' 6"  (1.676 m)     Head Circumference      Peak Flow      Pain Score 10     Pain Loc      Pain Edu?      Excl. in GC?      Constitutional: Alert and oriented.  HEENT      Head: Normocephalic and atraumatic.      Eyes: Conjunctivae are normal. Pupils equal and round.       Ears:         Nose: No congestion/rhinnorhea.      Mouth/Throat: Mucous membranes are moist.      Neck: No stridor. Cardiovascular/Chest: Normal rate, regular rhythm.  No murmurs, rubs, or gallops. Respiratory: Normal respiratory effort without tachypnea nor retractions. Breath sounds are clear and equal bilaterally. No wheezes/rales/rhonchi. Gastrointestinal: Soft. No distention, no guarding, no rebound.  Mild lower abdominal tenderness. Genitourinary/rectal: Brown stool, moderately hard large amount in the rectum. Musculoskeletal: Nontender with normal range of motion in all extremities. No joint effusions.  No lower extremity tenderness.  No edema. Neurologic:  Normal speech and language. No gross or focal neurologic deficits are appreciated. Skin:  Skin is warm, dry  and intact. No rash noted. Psychiatric: Mood and affect are normal. Speech and behavior are normal. Patient exhibits appropriate insight and judgment.   ____________________________________________  LABS (pertinent positives/negatives) I, Governor Rooks, MD the attending physician have reviewed the labs noted below.  Labs Reviewed - No data to display  ____________________________________________    EKG I, Governor Rooks, MD, the attending physician have personally viewed and interpreted all ECGs.  None ____________________________________________  RADIOLOGY   None __________________________________________  PROCEDURES  Procedure(s) performed: Manual disimpaction of the rectum for stool.  Performed by myself, Dr. Shaune Pollack MD.  Fair amount of brown stool was removed manually by digital disimpaction.  No palpitation.  Procedures  Critical Care performed: None   ____________________________________________  ED COURSE / ASSESSMENT AND PLAN  Pertinent labs & imaging results that were available during my care of the patient were reviewed by me and considered in my medical decision making (see chart for details).     Symptoms consistent clinically for constipation and fecal impaction.  I was able to manually disimpact a fair amount of stool and then patient was given soapsuds enema with good result.  Patient had relief of symptoms and able to go home.    CONSULTATIONS:   None   Patient / Family / Caregiver informed of clinical course, medical decision-making process, and agree with plan.   I discussed return precautions, follow-up instructions, and discharge instructions with patient and/or family.  Discharge Instructions : Return to the emergency room immediately for any worsening condition including abdominal pain, black or bloody stools, or inability to have bowel movement.    ___________________________________________   FINAL CLINICAL IMPRESSION(S) / ED  DIAGNOSES   Final diagnoses:  Constipation, unspecified constipation type  Fecal impaction in rectum (HCC)      ___________________________________________         Note: This dictation was prepared with Dragon dictation. Any transcriptional errors that result from this process are unintentional    Governor Rooks, MD 12/17/17 1051

## 2017-12-17 NOTE — ED Provider Notes (Signed)
[facility] Emergency Department Provider Note  ____________________________________________   None    (approximate)  I have reviewed the triage vital signs and the nursing notes.   HISTORY  Chief Complaint Chest Pain   HPI Gregory Mendoza is a 72 y.o. male self presents to the emergency department with sudden onset substernal pressure-like chest pain associated with shortness of breath and nausea that began at 10:15 PM  roughly 1 hour prior to arrival.  His symptoms are constant.  He took 2 nitroglycerin which did not help.  The pain was not ripping or tearing and did not go straight to his back.  2 days ago the patient had a cardiac catheterization and had 2 drug-eluting stents placed for non-STEMI.  He was discharged home however has not yet filled his prescription for Brilinta.  He has been taking his aspirin.  His symptoms are now moderate to severe and constant.  They are nonradiating.  Nothing seems to make his symptoms better or worse in particular.   Past Medical History:  Diagnosis Date  . Chronic back pain   . Diabetes mellitus without complication (HCC)   . Hyperlipidemia   . Hypertension   . Myocardial infarction [facility]) 2007, 2009  . Peripheral neuropathy     Patient Active Problem List   Diagnosis Date Noted  . STEMI involving right coronary artery (HCC) 12/18/2017  . NSTEMI (non-ST elevated myocardial infarction) (HCC) 12/13/2017  . Chest pain 12/12/2017  . Groin pain, right 03/04/2016    Past Surgical History:  Procedure Laterality Date  . APPENDECTOMY  1970's  . BACK SURGERY  1997  . COLONOSCOPY  2017  . CORONARY STENT INTERVENTION N/A 12/14/2017   Procedure: CORONARY STENT INTERVENTION;  Surgeon: Alwyn Pea, MD;  Location: [facility] INVASIVE CV LAB;  Service: Cardiovascular;  Laterality: N/A;  . HERNIA REPAIR Left 02/08/2008   Dr Lemar Livings  . INGUINAL HERNIA REPAIR Right 1999   with mesh while in [place]  . LEFT HEART CATH AND  CORONARY ANGIOGRAPHY N/A 12/14/2017   Procedure: LEFT HEART CATH AND CORONARY ANGIOGRAPHY;  Surgeon: Lamar Blinks, MD;  Location: [facility] INVASIVE CV LAB;  Service: Cardiovascular;  Laterality: N/A;    Prior to Admission medications   Medication Sig Start Date End Date Taking? Authorizing Provider  acetaminophen (TYLENOL) 500 MG tablet Take 500-1,000 mg by mouth every 6 (six) hours as needed for pain. 12/04/17  Yes [provider]  aspirin 81 MG chewable tablet Chew 81 mg by mouth daily.    Yes [provider]  atorvastatin (LIPITOR) 80 MG tablet Take 1 tablet (80 mg total) by mouth daily. 12/16/17 01/15/18 Yes Pyreddy, Vivien Rota, MD  diclofenac sodium (VOLTAREN) 1 % GEL Apply 4 g topically 4 (four) times daily.   Yes [provider]  gabapentin (NEURONTIN) 600 MG tablet Take 600 mg by mouth 3 (three) times daily.    Yes [provider]  glimepiride (AMARYL) 4 MG tablet Take 4 mg by mouth daily with breakfast.    Yes [provider]  insulin NPH Human (HUMULIN N,NOVOLIN N) 100 UNIT/ML injection Inject 15 Units into the skin 2 (two) times daily before a meal.   Yes [provider]  isosorbide mononitrate (IMDUR) 30 MG 24 hr tablet Take 30 mg by mouth daily.   Yes [provider]  lisinopril (PRINIVIL,ZESTRIL) 10 MG tablet Take 10 mg by mouth daily.    Yes [provider]  metFORMIN (GLUCOPHAGE) 1000 MG tablet Take  1,000 mg by mouth 2 (two) times daily with a meal.    Yes [provider]  metoprolol (LOPRESSOR) 50 MG tablet Take 50 mg by mouth 2 (two) times daily.    Yes [provider]  Multiple Vitamins-Minerals (MULTIVITAMIN ADULT) TABS Take 1 tablet by mouth daily. 12/04/17  Yes [provider]  pioglitazone (ACTOS) 30 MG tablet Take 30 mg by mouth daily.    Yes [provider]  REDNESS RELIEVER EYE DROPS 0.05 % ophthalmic solution Place 1 drop into both eyes as directed. 12/04/17  Yes  [provider]  ticagrelor (BRILINTA) 90 MG TABS tablet Take 1 tablet (90 mg total) by mouth 2 (two) times daily. 12/15/17 01/14/18 Yes PyreddyVivien Rota, MD    Allergies Patient has no known allergies.  No family history on file.  Social History Social History   Tobacco Use  . Smoking status: Former Smoker    Years: 40.00    Last attempt to quit: 02/24/1998    Years since quitting: 19.8  . Smokeless tobacco: Never Used  Substance Use Topics  . Alcohol use: No  . Drug use: No    Review of Systems Constitutional: No fever/chills Eyes: No visual changes. ENT: No sore throat. Cardiovascular: Positive for chest pain. Respiratory: Positive for shortness of breath. Gastrointestinal: No abdominal pain.  Positive for nausea, no vomiting.  No diarrhea.  No constipation. Genitourinary: Negative for dysuria. Musculoskeletal: Negative for back pain. Skin: Negative for rash. Neurological: Negative for headaches, focal weakness or numbness.   ____________________________________________   PHYSICAL EXAM:  VITAL SIGNS: ED Triage Vitals [12/17/17 2312]  Enc Vitals Group     BP (!) 191/90     Pulse Rate 95     Resp 18     Temp 98.4 F (36.9 C)     Temp Source Oral     SpO2 95 %     Weight 160 lb 15 oz (73 kg)     Height      Head Circumference      Peak Flow      Pain Score 9     Pain Loc      Pain Edu?      Excl. in GC?     Constitutional: Alert and oriented x4 uncomfortable appearing holding his chest Eyes: PERRL EOMI. Head: Atraumatic. Nose: No congestion/rhinnorhea. Mouth/Throat: No trismus Neck: No stridor.   Cardiovascular: Normal rate, regular rhythm. Grossly normal heart sounds.  Good peripheral circulation. Respiratory: Normal respiratory effort.  No retractions. Lungs CTAB and moving good air Gastrointestinal: Soft nontender Musculoskeletal: No lower extremity edema   Neurologic:  Normal speech and language. No gross focal neurologic deficits are  appreciated. Skin: Mildly diaphoretic Psychiatric: Somewhat anxious appearing    ____________________________________________   DIFFERENTIAL includes but not limited to  In-stent rethrombosis, STEMI, non-STEMI ____________________________________________   LABS (all labs ordered are listed, but only abnormal results are displayed)  Labs Reviewed  BASIC METABOLIC PANEL - Abnormal; Notable for the following components:      Result Value   Glucose, Bld 164 (*)    BUN 24 (*)    All other components within normal limits  CBC - Abnormal; Notable for the following components:   WBC 12.4 (*)    Platelets 425 (*)    All other components within normal limits  TROPONIN I - Abnormal; Notable for the following components:   Troponin I 0.16 (*)    All other components within normal limits  TROPONIN I -  Abnormal; Notable for the following components:   Troponin I 1.90 (*)    All other components within normal limits  TROPONIN I - Abnormal; Notable for the following components:   Troponin I 10.50 (*)    All other components within normal limits  CBC - Abnormal; Notable for the following components:   RBC 4.05 (*)    Hemoglobin 12.2 (*)    HCT 37.6 (*)    All other components within normal limits  BASIC METABOLIC PANEL - Abnormal; Notable for the following components:   Glucose, Bld 154 (*)    Calcium 8.6 (*)    All other components within normal limits  GLUCOSE, CAPILLARY - Abnormal; Notable for the following components:   Glucose-Capillary 187 (*)    All other components within normal limits  GLUCOSE, CAPILLARY - Abnormal; Notable for the following components:   Glucose-Capillary 140 (*)    All other components within normal limits  MRSA PCR SCREENING  CKMB([facility] ONLY)  MYOGLOBIN, SERUM  TROPONIN I  POCT ACTIVATED CLOTTING TIME    Lab work reviewed by me with elevated troponin concerning for acute myocardial ischemia __________________________________________  EKG  ED  ECG REPORT I, Merrily Brittle, the attending physician, personally viewed and interpreted this ECG.  Date: 12/17/2017 EKG Time: 2314 Rate: 89 Rhythm: normal sinus rhythm QRS Axis: Leftward axis Intervals: normal ST/T Wave abnormalities: Concave up ST elevation in 3 and aVF although not 1 mm.  T wave inversion and ST depression in aVL Narrative Interpretation: Concerning for acute ischemia but not STEMI criteria  ED ECG REPORT I, Merrily Brittle, the attending physician, personally viewed and interpreted this ECG.  Date: 12/17/2017 EKG Time: 2327 Rate: 91 Rhythm: normal sinus rhythm QRS Axis: Leftward axis Intervals: normal ST/T Wave abnormalities: Worsening T wave inversion and ST depression in aVL although elevation in 3 and aVF seems to have normalized Narrative Interpretation: Concerning for ischemia but not STEMI  ED ECG REPORT I, Merrily Brittle, the attending physician, personally viewed and interpreted this ECG.  Date: 12/17/2017 EKG Time: 2336 Rate: 91 Rhythm: normal sinus rhythm QRS Axis: Leftward axis Intervals: normal ST/T Wave abnormalities: This is a right-sided EKG showing significant straightening of the T wave in lead II and III persistent T wave inversion in aVL and now with elevation in leads are V5 and are V6 which is consistent with STEMI Narrative Interpretation: Meets criteria for right sided STEMI  ED ECG REPORT I, Merrily Brittle, the attending physician, personally viewed and interpreted this ECG.  Date: 12/17/2017 EKG Time: 2337 Rate: 94 Rhythm: normal sinus rhythm QRS Axis: Leftward axis Intervals: normal ST/T Wave abnormalities: This is a posterior EKG which shows no elevation Narrative Interpretation: Concerning for acute ischemia although not STEMI criteria on this EKG  ____________________________________________  RADIOLOGY  Chest x-ray reviewed by me with no acute  disease ____________________________________________   PROCEDURES  Procedure(s) performed: no  .Critical Care Performed by: Merrily Brittle, MD Authorized by: Merrily Brittle, MD   Critical care provider statement:    Critical care time (minutes):  40   Critical care time was exclusive of:  Separately billable procedures and treating other patients   Critical care was necessary to treat or prevent imminent or life-threatening deterioration of the following conditions:  Cardiac failure   Critical care was time spent personally by me on the following activities:  Development of treatment plan with patient or surrogate, discussions with consultants, evaluation of patient's response to treatment, examination of patient, obtaining history from  patient or surrogate, ordering and performing treatments and interventions, ordering and review of laboratory studies, ordering and review of radiographic studies, pulse oximetry, re-evaluation of patient's condition and review of old charts    Critical Care performed: Yes  ____________________________________________   INITIAL IMPRESSION / ASSESSMENT AND PLAN / ED COURSE  Pertinent labs & imaging results that were available during my care of the patient were reviewed by me and considered in my medical decision making (see chart for details).   As part of my medical decision making, I reviewed the following data within the electronic MEDICAL RECORD NUMBER History obtained from family if available, nursing notes, old chart and ekg, as well as notes from prior ED visits.  The patient comes to the emergency department with a very concerning story for angina.  He had 2 stents placed in his right coronary artery 2 days ago and has continued to take his daily aspirin although unfortunately has not yet filled his Brilinta.  His first EKG shows elevation in leads III and aVF although not 1 mm although does have reciprocal depression in aVL.  Repeat EKG is actually  improved but quite different.  Third EKG performed on the right side has lateral elevation concerning for STEMI.  I have activated the catheterization lab.  I then discussed with Dr. Juliann Pares who has graciously agreed to come evaluate the patient.     Dr. Juliann Pares came to bedside and agrees that the EKGs are concerning for STEMI.  This is likely secondary to in-stent rethrombosis as the patient has been noncompliant with his Brilinta.  Family updated throughout the course of the case and the patient was taken to the catheterization lab in critical condition. ____________________________________________   FINAL CLINICAL IMPRESSION(S) / ED DIAGNOSES  Final diagnoses:  ST elevation myocardial infarction involving right coronary artery (HCC)      NEW MEDICATIONS STARTED DURING THIS VISIT:  Current Discharge Medication List       Note:  This document was prepared using Dragon voice recognition software and may include unintentional dictation errors.     Merrily Brittle, MD 12/18/17 [phone]

## 2017-12-18 ENCOUNTER — Encounter
Admission: EM | Disposition: A | Discharge status: Home / Self Care | Payer: Self-pay | Source: Home / Self Care | Attending: Family Medicine

## 2017-12-18 ENCOUNTER — Encounter: Payer: Self-pay | Admitting: Internal Medicine

## 2017-12-18 ENCOUNTER — Other Ambulatory Visit: Payer: Self-pay

## 2017-12-18 DIAGNOSIS — Z955 Presence of coronary angioplasty implant and graft: Secondary | ICD-10-CM | POA: Diagnosis not present

## 2017-12-18 DIAGNOSIS — I228 Subsequent ST elevation (STEMI) myocardial infarction of other sites: Secondary | ICD-10-CM | POA: Diagnosis present

## 2017-12-18 DIAGNOSIS — J449 Chronic obstructive pulmonary disease, unspecified: Secondary | ICD-10-CM | POA: Diagnosis present

## 2017-12-18 DIAGNOSIS — Z9119 Patient's noncompliance with other medical treatment and regimen: Secondary | ICD-10-CM | POA: Diagnosis not present

## 2017-12-18 DIAGNOSIS — I2129 ST elevation (STEMI) myocardial infarction involving other sites: Secondary | ICD-10-CM | POA: Diagnosis present

## 2017-12-18 DIAGNOSIS — Z87891 Personal history of nicotine dependence: Secondary | ICD-10-CM | POA: Diagnosis not present

## 2017-12-18 DIAGNOSIS — Z7902 Long term (current) use of antithrombotics/antiplatelets: Secondary | ICD-10-CM | POA: Diagnosis not present

## 2017-12-18 DIAGNOSIS — I10 Essential (primary) hypertension: Secondary | ICD-10-CM | POA: Diagnosis present

## 2017-12-18 DIAGNOSIS — Z7982 Long term (current) use of aspirin: Secondary | ICD-10-CM | POA: Diagnosis not present

## 2017-12-18 DIAGNOSIS — Z8249 Family history of ischemic heart disease and other diseases of the circulatory system: Secondary | ICD-10-CM | POA: Diagnosis not present

## 2017-12-18 DIAGNOSIS — Z79899 Other long term (current) drug therapy: Secondary | ICD-10-CM | POA: Diagnosis not present

## 2017-12-18 DIAGNOSIS — I2111 ST elevation (STEMI) myocardial infarction involving right coronary artery: Secondary | ICD-10-CM | POA: Diagnosis present

## 2017-12-18 DIAGNOSIS — Z794 Long term (current) use of insulin: Secondary | ICD-10-CM | POA: Diagnosis not present

## 2017-12-18 DIAGNOSIS — Y838 Other surgical procedures as the cause of abnormal reaction of the patient, or of later complication, without mention of misadventure at the time of the procedure: Secondary | ICD-10-CM | POA: Diagnosis present

## 2017-12-18 DIAGNOSIS — G629 Polyneuropathy, unspecified: Secondary | ICD-10-CM | POA: Diagnosis present

## 2017-12-18 DIAGNOSIS — I252 Old myocardial infarction: Secondary | ICD-10-CM | POA: Diagnosis not present

## 2017-12-18 DIAGNOSIS — E785 Hyperlipidemia, unspecified: Secondary | ICD-10-CM | POA: Diagnosis present

## 2017-12-18 DIAGNOSIS — R079 Chest pain, unspecified: Secondary | ICD-10-CM | POA: Diagnosis not present

## 2017-12-18 DIAGNOSIS — T82855A Stenosis of coronary artery stent, initial encounter: Secondary | ICD-10-CM | POA: Diagnosis present

## 2017-12-18 HISTORY — PX: LEFT HEART CATH AND CORONARY ANGIOGRAPHY: CATH118249

## 2017-12-18 HISTORY — PX: CORONARY/GRAFT ACUTE MI REVASCULARIZATION: CATH118305

## 2017-12-18 LAB — BASIC METABOLIC PANEL
ANION GAP: 7 (ref 5–15)
BUN: 17 mg/dL (ref 8–23)
CO2: 25 mmol/L (ref 22–32)
CREATININE: 0.75 mg/dL (ref 0.61–1.24)
Calcium: 8.6 mg/dL — ABNORMAL LOW (ref 8.9–10.3)
Chloride: 108 mmol/L (ref 98–111)
GFR calc Af Amer: >60 mL/min (ref >60)
GFR calc non Af Amer: >60 mL/min (ref >60)
Glucose, Bld: 154 mg/dL — ABNORMAL HIGH (ref 70–99)
POTASSIUM: 3.9 mmol/L (ref 3.5–5.1)
Sodium: 140 mmol/L (ref 135–145)

## 2017-12-18 LAB — GLUCOSE, CAPILLARY
GLUCOSE-CAPILLARY: 187 mg/dL — AB (ref 70–99)
GLUCOSE-CAPILLARY: 140 mg/dL — AB (ref 70–99)
GLUCOSE-CAPILLARY: 126 mg/dL — AB (ref 70–99)
GLUCOSE-CAPILLARY: 177 mg/dL — AB (ref 70–99)
Glucose-Capillary: 157 mg/dL — ABNORMAL HIGH (ref 70–99)

## 2017-12-18 LAB — CBC
HEMATOCRIT: 37.6 % — AB (ref 39.0–52.0)
Hemoglobin: 12.2 g/dL — ABNORMAL LOW (ref 13.0–17.0)
MCH: 30.1 pg (ref 26.0–34.0)
MCHC: 32.4 g/dL (ref 30.0–36.0)
MCV: 92.8 fL (ref 80.0–100.0)
NRBC: 0 % (ref 0.0–0.2)
Platelets: 353 10*3/uL (ref 150–400)
RBC: 4.05 MIL/uL — AB (ref 4.22–5.81)
RDW: 13.2 % (ref 11.5–15.5)
WBC: 8.9 10*3/uL (ref 4.0–10.5)

## 2017-12-18 LAB — TROPONIN I
TROPONIN I: 1.9 ng/mL — AB (ref <0.03)
TROPONIN I: 9.89 ng/mL — AB (ref <0.03)
Troponin I: 10.5 ng/mL (ref <0.03)

## 2017-12-18 LAB — CKMB (ARMC ONLY): CK, MB: 2.6 ng/mL (ref 0.5–5.0)

## 2017-12-18 LAB — POCT ACTIVATED CLOTTING TIME: Activated Clotting Time: 362 seconds

## 2017-12-18 LAB — MRSA PCR SCREENING: MRSA BY PCR: NEGATIVE

## 2017-12-18 SURGERY — CORONARY/GRAFT ACUTE MI REVASCULARIZATION
Anesthesia: Moderate Sedation

## 2017-12-18 MED ORDER — TIROFIBAN HCL IV 12.5 MG/250 ML
INTRAVENOUS | Status: AC | PRN
  Administered 2017-12-18: 0.15 ug/kg/min via INTRAVENOUS

## 2017-12-18 MED ORDER — SODIUM CHLORIDE 0.9% FLUSH
3.0000 mL | Freq: Two times a day (BID) | INTRAVENOUS | Status: DC
  Administered 2017-12-18 – 2017-12-20 (×4): 3 mL via INTRAVENOUS

## 2017-12-18 MED ORDER — TICAGRELOR 90 MG PO TABS
90.0000 mg | ORAL_TABLET | Freq: Two times a day (BID) | ORAL | Status: DC
  Administered 2017-12-18 – 2017-12-20 (×5): 90 mg via ORAL
  Filled 2017-12-18 (×6): qty 1

## 2017-12-18 MED ORDER — HYDROCODONE-ACETAMINOPHEN 5-325 MG PO TABS: 1.0000 | ORAL_TABLET | ORAL | Status: DC | PRN

## 2017-12-18 MED ORDER — HEPARIN (PORCINE) IN NACL 1000-0.9 UT/500ML-% IV SOLN
INTRAVENOUS | Status: DC | PRN
  Administered 2017-12-18: 1000 mL

## 2017-12-18 MED ORDER — BIVALIRUDIN TRIFLUOROACETATE 250 MG IV SOLR
INTRAVENOUS | Status: AC
  Filled 2017-12-18: qty 250

## 2017-12-18 MED ORDER — LABETALOL HCL 5 MG/ML IV SOLN
INTRAVENOUS | Status: AC
  Filled 2017-12-18: qty 4

## 2017-12-18 MED ORDER — BIVALIRUDIN BOLUS VIA INFUSION - CUPID
INTRAVENOUS | Status: DC | PRN
  Administered 2017-12-18: 54.75 mg via INTRAVENOUS

## 2017-12-18 MED ORDER — IOPAMIDOL (ISOVUE-300) INJECTION 61%
INTRAVENOUS | Status: DC | PRN
  Administered 2017-12-18: 150 mL

## 2017-12-18 MED ORDER — ATORVASTATIN CALCIUM 20 MG PO TABS
80.0000 mg | ORAL_TABLET | Freq: Every day | ORAL | Status: DC
  Administered 2017-12-18 – 2017-12-19 (×2): 80 mg via ORAL
  Filled 2017-12-18 (×2): qty 4

## 2017-12-18 MED ORDER — ONDANSETRON HCL 4 MG PO TABS: 4.0000 mg | ORAL_TABLET | Freq: Four times a day (QID) | ORAL | Status: DC | PRN

## 2017-12-18 MED ORDER — LISINOPRIL 10 MG PO TABS
10.0000 mg | ORAL_TABLET | Freq: Every day | ORAL | Status: DC
  Administered 2017-12-18 – 2017-12-20 (×3): 10 mg via ORAL
  Filled 2017-12-18: qty 2
  Filled 2017-12-18: qty 1
  Filled 2017-12-18: qty 2

## 2017-12-18 MED ORDER — TIROFIBAN (AGGRASTAT) BOLUS VIA INFUSION
INTRAVENOUS | Status: DC | PRN
  Administered 2017-12-18: 1825 ug via INTRAVENOUS

## 2017-12-18 MED ORDER — METOPROLOL TARTRATE 50 MG PO TABS
50.0000 mg | ORAL_TABLET | Freq: Two times a day (BID) | ORAL | Status: DC
  Administered 2017-12-18 – 2017-12-20 (×5): 50 mg via ORAL
  Filled 2017-12-18 (×5): qty 1

## 2017-12-18 MED ORDER — BISACODYL 5 MG PO TBEC: 5.0000 mg | DELAYED_RELEASE_TABLET | Freq: Every day | ORAL | Status: DC | PRN

## 2017-12-18 MED ORDER — HYDRALAZINE HCL 20 MG/ML IJ SOLN: 5.0000 mg | INTRAMUSCULAR | Status: AC | PRN

## 2017-12-18 MED ORDER — INSULIN ASPART 100 UNIT/ML ~~LOC~~ SOLN: 2.0000 [IU] | SUBCUTANEOUS | Status: DC

## 2017-12-18 MED ORDER — ACETAMINOPHEN 325 MG PO TABS
650.0000 mg | ORAL_TABLET | Freq: Four times a day (QID) | ORAL | Status: DC | PRN
  Administered 2017-12-19: 650 mg via ORAL

## 2017-12-18 MED ORDER — ASPIRIN 81 MG PO CHEW
81.0000 mg | CHEWABLE_TABLET | Freq: Every day | ORAL | Status: DC
  Administered 2017-12-18 – 2017-12-20 (×3): 81 mg via ORAL
  Filled 2017-12-18 (×3): qty 1

## 2017-12-18 MED ORDER — INSULIN ASPART 100 UNIT/ML ~~LOC~~ SOLN: 0.0000 [IU] | Freq: Every day | SUBCUTANEOUS | Status: DC

## 2017-12-18 MED ORDER — ADULT MULTIVITAMIN W/MINERALS CH
1.0000 | ORAL_TABLET | Freq: Every day | ORAL | Status: DC
  Administered 2017-12-18 – 2017-12-20 (×3): 1 via ORAL
  Filled 2017-12-18 (×3): qty 1

## 2017-12-18 MED ORDER — TRAZODONE HCL 50 MG PO TABS: 25.0000 mg | ORAL_TABLET | Freq: Every evening | ORAL | Status: DC | PRN

## 2017-12-18 MED ORDER — INSULIN NPH (HUMAN) (ISOPHANE) 100 UNIT/ML ~~LOC~~ SUSP: 15.0000 [IU] | Freq: Two times a day (BID) | SUBCUTANEOUS | Status: DC

## 2017-12-18 MED ORDER — SODIUM CHLORIDE 0.9 % IV SOLN
INTRAVENOUS | Status: DC | PRN
  Administered 2017-12-18: 1.75 mg/kg/h via INTRAVENOUS

## 2017-12-18 MED ORDER — ACETAMINOPHEN 325 MG PO TABS
650.0000 mg | ORAL_TABLET | ORAL | Status: DC | PRN
  Filled 2017-12-18: qty 2

## 2017-12-18 MED ORDER — GABAPENTIN 600 MG PO TABS
600.0000 mg | ORAL_TABLET | Freq: Three times a day (TID) | ORAL | Status: DC
  Administered 2017-12-18 – 2017-12-20 (×7): 600 mg via ORAL
  Filled 2017-12-18 (×8): qty 1

## 2017-12-18 MED ORDER — ACETAMINOPHEN 650 MG RE SUPP: 650.0000 mg | Freq: Four times a day (QID) | RECTAL | Status: DC | PRN

## 2017-12-18 MED ORDER — INSULIN ASPART 100 UNIT/ML ~~LOC~~ SOLN
0.0000 [IU] | Freq: Three times a day (TID) | SUBCUTANEOUS | Status: DC
  Administered 2017-12-18: 2 [IU] via SUBCUTANEOUS
  Administered 2017-12-18 (×2): 1 [IU] via SUBCUTANEOUS
  Administered 2017-12-19: 2 [IU] via SUBCUTANEOUS
  Administered 2017-12-19: 3 [IU] via SUBCUTANEOUS
  Administered 2017-12-20: 5 [IU] via SUBCUTANEOUS
  Administered 2017-12-20: 2 [IU] via SUBCUTANEOUS
  Filled 2017-12-18 (×7): qty 1

## 2017-12-18 MED ORDER — TIROFIBAN HCL IN NACL 5-0.9 MG/100ML-% IV SOLN
0.1500 ug/kg/min | INTRAVENOUS | Status: DC
  Administered 2017-12-18: 0.15 ug/kg/min via INTRAVENOUS
  Filled 2017-12-18 (×7): qty 100

## 2017-12-18 MED ORDER — ONDANSETRON HCL 4 MG/2ML IJ SOLN: 4.0000 mg | Freq: Four times a day (QID) | INTRAMUSCULAR | Status: DC | PRN

## 2017-12-18 MED ORDER — ISOSORBIDE MONONITRATE ER 30 MG PO TB24
30.0000 mg | ORAL_TABLET | Freq: Every day | ORAL | Status: DC
  Administered 2017-12-18 – 2017-12-20 (×3): 30 mg via ORAL
  Filled 2017-12-18 (×5): qty 1

## 2017-12-18 MED ORDER — TICAGRELOR 90 MG PO TABS
180.0000 mg | ORAL_TABLET | Freq: Once | ORAL | Status: AC
  Administered 2017-12-18: 180 mg via ORAL
  Filled 2017-12-18: qty 2

## 2017-12-18 MED ORDER — TIROFIBAN HCL IV 12.5 MG/250 ML
INTRAVENOUS | Status: AC
  Filled 2017-12-18: qty 250

## 2017-12-18 MED ORDER — SODIUM CHLORIDE 0.9% FLUSH: 3.0000 mL | INTRAVENOUS | Status: DC | PRN

## 2017-12-18 MED ORDER — DOCUSATE SODIUM 100 MG PO CAPS
100.0000 mg | ORAL_CAPSULE | Freq: Two times a day (BID) | ORAL | Status: DC
  Administered 2017-12-18 – 2017-12-20 (×5): 100 mg via ORAL
  Filled 2017-12-18 (×5): qty 1

## 2017-12-18 MED ORDER — LABETALOL HCL 5 MG/ML IV SOLN: 10.0000 mg | INTRAVENOUS | Status: AC | PRN

## 2017-12-18 MED ORDER — LABETALOL HCL 5 MG/ML IV SOLN
INTRAVENOUS | Status: DC | PRN
  Administered 2017-12-18: 5 mg via INTRAVENOUS

## 2017-12-18 MED ORDER — SODIUM CHLORIDE 0.9 % IV SOLN: 250.0000 mL | INTRAVENOUS | Status: DC | PRN

## 2017-12-18 MED ORDER — SODIUM CHLORIDE 0.9 % WEIGHT BASED INFUSION
1.0000 mL/kg/h | INTRAVENOUS | Status: AC
  Administered 2017-12-18: 1 mL/kg/h via INTRAVENOUS

## 2017-12-18 MED ORDER — INSULIN DETEMIR 100 UNIT/ML ~~LOC~~ SOLN
15.0000 [IU] | Freq: Two times a day (BID) | SUBCUTANEOUS | Status: DC
  Administered 2017-12-18 – 2017-12-20 (×5): 15 [IU] via SUBCUTANEOUS
  Filled 2017-12-18 (×7): qty 0.15

## 2017-12-18 SURGICAL SUPPLY — 19 items
BALLN ~~LOC~~ EUPHORA RX 3.25X15 (BALLOONS) ×3
BALLOON ~~LOC~~ EUPHORA RX 3.25X15 (BALLOONS) ×1 IMPLANT
CATH INFINITI 5FR ANG PIGTAIL (CATHETERS) ×3 IMPLANT
CATH INFINITI 5FR JL4 (CATHETERS) ×3 IMPLANT
CATH PRIORITY ONE AC 6F (CATHETERS) ×3 IMPLANT
CATH VISTA GUIDE 6FR JR4 SH (CATHETERS) ×3 IMPLANT
CATHETER LAUNCHER 6FR JR4 SH (CATHETERS) IMPLANT
DEVICE CLOSURE MYNXGRIP 6/7F (Vascular Products) ×3 IMPLANT
DEVICE INFLAT 30 PLUS (MISCELLANEOUS) ×3 IMPLANT
KIT MANI 3VAL PERCEP (MISCELLANEOUS) ×3 IMPLANT
NEEDLE ENTRY 21GA 7CM ECHOTIP (NEEDLE) IMPLANT
NEEDLE PERC 18GX7CM (NEEDLE) ×3 IMPLANT
PACK CARDIAC CATH (CUSTOM PROCEDURE TRAY) ×3 IMPLANT
PROTECTION STATION PRESSURIZED (MISCELLANEOUS) ×3
SHEATH AVANTI 6FR X 11CM (SHEATH) ×3 IMPLANT
STATION PROTECTION PRESSURIZED (MISCELLANEOUS) ×1 IMPLANT
SYR MEDRAD MARK V 150ML (SYRINGE) ×3 IMPLANT
WIRE G HI TQ BMW 190 (WIRE) ×3 IMPLANT
WIRE GUIDERIGHT .035X150 (WIRE) ×3 IMPLANT

## 2017-12-18 NOTE — Progress Notes (Signed)
eLink Physician-Brief Progress Note Patient Name: Gregory Mendoza DOB: [birth year] MRN: [id number]   Date of Service  12/18/2017  HPI/Events of Note  STEMI secondary to in-stent thrombosis of recent stent likely from not being able to fill Brilinta.  Underwent LHC via right femoral with successful angioplasty and clot extraction.  Now on prolonged Aggrastat infusion as well as aspirin and Brilinta.  eICU Interventions  Continue antiplatelets, statin beta blocker. Hold Metformin post cath, start sliding scale insulin.     Intervention Category Evaluation Type: New Patient Evaluation  Darl Pikes 12/18/2017, 2:17 AM

## 2017-12-18 NOTE — Progress Notes (Signed)
[facility] [place] [facility]   PATIENT NAME: Gregory Mendoza    MR#:  [id number]  DATE OF BIRTH:  [birth year]  SUBJECTIVE:  CHIEF COMPLAINT: Patient came with chest pain, STEMI again patient just was discharged from the hospital 2 days ago after stent was placed for coronary artery disease.  Balloon angioplasty done and clot extraction was done  REVIEW OF SYSTEMS:  CONSTITUTIONAL: No fever, fatigue or weakness.  EYES: No blurred or double vision.  EARS, NOSE, AND THROAT: No tinnitus or ear pain.  RESPIRATORY: No cough, shortness of breath, wheezing or hemoptysis.  CARDIOVASCULAR: No chest pain, orthopnea, edema.  GASTROINTESTINAL: No nausea, vomiting, diarrhea or abdominal pain.  GENITOURINARY: No dysuria, hematuria.  ENDOCRINE: No polyuria, nocturia,  HEMATOLOGY: No anemia, easy bruising or bleeding SKIN: No rash or lesion. MUSCULOSKELETAL: No joint pain or arthritis.   NEUROLOGIC: No tingling, numbness, weakness.  PSYCHIATRY: No anxiety or depression.   DRUG ALLERGIES:  No Known Allergies  VITALS:  Blood pressure 107/69, pulse 92, temperature 97.8 F (36.6 C), resp. rate 20, height 5\' 6"  (1.676 m), weight 73.2 kg, SpO2 97 %.  PHYSICAL EXAMINATION:  GENERAL:  72 y.o.-year-old patient lying in the bed with no acute distress.  EYES: Pupils equal, round, reactive to light and accommodation. No scleral icterus. Extraocular muscles intact.  HEENT: Head atraumatic, normocephalic. Oropharynx and nasopharynx clear.  NECK:  Supple, no jugular venous distention. No thyroid enlargement, no tenderness.  LUNGS: Normal breath sounds bilaterally, no wheezing, rales,rhonchi or crepitation. No use of accessory muscles of respiration.  CARDIOVASCULAR: S1, S2 normal. No murmurs, rubs, or gallops.  ABDOMEN: Soft, nontender, nondistended. Bowel sounds present. No organomegaly or mass.  EXTREMITIES: No pedal edema, cyanosis, or clubbing.  NEUROLOGIC: Cranial nerves II  through XII are intact. Muscle strength 5/5 in all extremities. Sensation intact. Gait not checked.  PSYCHIATRIC: The patient is alert and oriented x 3.  SKIN: No obvious rash, lesion, or ulcer.    LABORATORY PANEL:   CBC Recent Labs  Lab 12/18/17 0745  WBC 8.9  HGB 12.2*  HCT 37.6*  PLT 353   ------------------------------------------------------------------------------------------------------------------  Chemistries  Recent Labs  Lab 12/18/17 0745  NA 140  K 3.9  CL 108  CO2 25  GLUCOSE 154*  BUN 17  CREATININE 0.75  CALCIUM 8.6*   ------------------------------------------------------------------------------------------------------------------  Cardiac Enzymes Recent Labs  Lab 12/18/17 1427  TROPONINI 9.89*   ------------------------------------------------------------------------------------------------------------------  RADIOLOGY:  Dg Chest 2 View  Result Date: 12/17/2017 CLINICAL DATA:  Chest pain, diaphoresis EXAM: CHEST - 2 VIEW COMPARISON:  12/12/2017 FINDINGS: There is hyperinflation of the lungs compatible with COPD. Heart is normal size. Linear scarring in the lung bases. Rounded calcified nodule at the left lung base compatible with granuloma. Left upper lobe granuloma also noted. No effusions. No acute bony abnormality. IMPRESSION: COPD/chronic changes. Old granulomatous disease. No active disease. Electronically Signed   By: Charlett Nose M.D.   On: 12/17/2017 23:29    EKG:   Orders placed or performed during the hospital encounter of 12/17/17  . ED EKG within 10 minutes  . ED EKG within 10 minutes  . Repeat EKG  . Repeat EKG  . EKG 12-Lead  . EKG 12-Lead  . EKG    ASSESSMENT AND PLAN:   1. STEMI, secondary to occlusion of recent stents, most likely due to noncompliance with Brilinta. Clinically stable, reinforced the importance of taking Brilinta, patient verbalized understanding   Patient is status post angioplasty  with clot  extraction.  Currently, doing well, chest pain-free.   Continue medications per cardiology.  2.  Diabetes type 2.  Continue to monitor blood sugars before meals and at bedtime and use insulin treatment during the hospital stay.  We will hold p.o. meds, while in hospital.  3.  Hypertension, resume home medication blood pressure is stable now   4.  Hyperlipidemia, on statin.   All the records are reviewed and case discussed with Care Management/Social Workerr. Management plans discussed with the patient, family and they are in agreement.  CODE STATUS: fc  TOTAL TIME TAKING CARE OF THIS PATIENT: 35 minutes.   POSSIBLE D/C IN 1-2DAYS, DEPENDING ON CLINICAL CONDITION.  Note: This dictation was prepared with Dragon dictation along with smaller phrase technology. Any transcriptional errors that result from this process are unintentional.   Ramonita Lab M.D on 12/18/2017 at 3:27 PM  Between 7am to 6pm - Pager - [phone] After 6pm go to [email or web address] - password [email or web address] [facility]  [place] [facility]  Office  [phone]  CC: Primary care physician; Lauro Regulus, MD

## 2017-12-18 NOTE — Progress Notes (Signed)
Good day. PAD off at 1430. Up to [facility] with help. No reports of chest pain. Up to chair for an hour without problems. Aggrastat remains at 13.17 as ordered.. Remains in SR/STac.

## 2017-12-18 NOTE — H&P (Signed)
[facility] - [place] at [facility]   PATIENT NAME: Gregory Mendoza    MR#:  [id number]  DATE OF BIRTH:  08-23-1945  DATE OF ADMISSION:  12/17/2017  PRIMARY CARE PHYSICIAN: Lauro Regulus, MD   REQUESTING/REFERRING PHYSICIAN:   CHIEF COMPLAINT:   Chief Complaint  Patient presents with  . Chest Pain    HISTORY OF PRESENT ILLNESS: Gregory Mendoza  is a 72 y.o. male with a known history of diabetes type 2, chronic back pain and CAD status post stent placement just few days ago, on 12/14/2017. Patient presented to emergency room for acute onset of severe retrosternal chest pain, radiating to his neck and jaw.  Patient admits that he did not start Brilinta after being discharged from the hospital 3 days ago. Blood test done emergency room showed elevated troponin level is 0.16. EKG shows ST elevation in the inferior leads. Chest x-ray chronic COPD, no acute abnormalities. Code STEMI was called in the emergency room and patient was taken emergently to the Cath Lab.  Cardiac cath revealed thrombosis of his previous stents.  Balloon angioplasty and clot extraction were performed, no additional stents needed.  Patient is currently chest pain-free, status post procedure. Patient was transferred to intensive care unit after the procedure, for further close monitoring.  PAST MEDICAL HISTORY:   Past Medical History:  Diagnosis Date  . Chronic back pain   . Diabetes mellitus without complication (HCC)   . Hyperlipidemia   . Hypertension   . Myocardial infarction [facility]) 2007, 2009  . Peripheral neuropathy     PAST SURGICAL HISTORY:  Past Surgical History:  Procedure Laterality Date  . APPENDECTOMY  1970's  . BACK SURGERY  1997  . COLONOSCOPY  2017  . CORONARY STENT INTERVENTION N/A 12/14/2017   Procedure: CORONARY STENT INTERVENTION;  Surgeon: Alwyn Pea, MD;  Location: [facility] INVASIVE CV LAB;  Service: Cardiovascular;  Laterality: N/A;  . HERNIA REPAIR  Left 02/08/2008   Dr Lemar Livings  . INGUINAL HERNIA REPAIR Right 1999   with mesh while in [place]  . LEFT HEART CATH AND CORONARY ANGIOGRAPHY N/A 12/14/2017   Procedure: LEFT HEART CATH AND CORONARY ANGIOGRAPHY;  Surgeon: Lamar Blinks, MD;  Location: [facility] INVASIVE CV LAB;  Service: Cardiovascular;  Laterality: N/A;    SOCIAL HISTORY:  Social History   Tobacco Use  . Smoking status: Former Smoker    Years: 40.00    Last attempt to quit: 02/24/1998    Years since quitting: 19.8  . Smokeless tobacco: Never Used  Substance Use Topics  . Alcohol use: No    FAMILY HISTORY: Hypertension and hyperlipidemia in both parents.  DRUG ALLERGIES: No Known Allergies  REVIEW OF SYSTEMS:   CONSTITUTIONAL: No fever, fatigue or weakness.  EYES: No changes in vision.  EARS, NOSE, AND THROAT: No tinnitus or ear pain.  RESPIRATORY: No cough, shortness of breath, wheezing or hemoptysis.  CARDIOVASCULAR: No chest pain, orthopnea, edema.  GASTROINTESTINAL: No nausea, vomiting, diarrhea or abdominal pain.  GENITOURINARY: No dysuria, hematuria.  ENDOCRINE: No polyuria, nocturia. HEMATOLOGY: No bleeding. SKIN: No rash or lesion. MUSCULOSKELETAL: No joint pain at this time.   NEUROLOGIC: No focal weakness.  PSYCHIATRY: No anxiety or depression.   MEDICATIONS AT HOME:  Prior to Admission medications   Medication Sig Start Date End Date Taking? Authorizing Provider  acetaminophen (TYLENOL) 500 MG tablet Take 500-1,000 mg by mouth every 6 (six) hours as needed for pain. 12/04/17  Yes [provider]  aspirin 81 MG chewable tablet Chew 81 mg by mouth daily.    Yes [provider]  atorvastatin (LIPITOR) 80 MG tablet Take 1 tablet (80 mg total) by mouth daily. 12/16/17 01/15/18 Yes Pyreddy, Vivien Rota, MD  diclofenac sodium (VOLTAREN) 1 % GEL Apply 4 g topically 4 (four) times daily.   Yes [provider]  gabapentin (NEURONTIN) 600 MG tablet Take 600 mg by mouth 3 (three) times  daily.    Yes [provider]  glimepiride (AMARYL) 4 MG tablet Take 4 mg by mouth daily with breakfast.    Yes [provider]  insulin NPH Human (HUMULIN N,NOVOLIN N) 100 UNIT/ML injection Inject 15 Units into the skin 2 (two) times daily before a meal.   Yes [provider]  isosorbide mononitrate (IMDUR) 30 MG 24 hr tablet Take 30 mg by mouth daily.   Yes [provider]  lisinopril (PRINIVIL,ZESTRIL) 10 MG tablet Take 10 mg by mouth daily.    Yes [provider]  metFORMIN (GLUCOPHAGE) 1000 MG tablet Take 1,000 mg by mouth 2 (two) times daily with a meal.    Yes [provider]  metoprolol (LOPRESSOR) 50 MG tablet Take 50 mg by mouth 2 (two) times daily.    Yes [provider]  Multiple Vitamins-Minerals (MULTIVITAMIN ADULT) TABS Take 1 tablet by mouth daily. 12/04/17  Yes [provider]  pioglitazone (ACTOS) 30 MG tablet Take 30 mg by mouth daily.    Yes [provider]  REDNESS RELIEVER EYE DROPS 0.05 % ophthalmic solution Place 1 drop into both eyes as directed. 12/04/17  Yes [provider]  ticagrelor (BRILINTA) 90 MG TABS tablet Take 1 tablet (90 mg total) by mouth 2 (two) times daily. 12/15/17 01/14/18 Yes Pyreddy, Vivien Rota, MD      PHYSICAL EXAMINATION:   VITAL SIGNS: Blood pressure (!) 182/96, pulse 98, temperature 98.4 F (36.9 C), temperature source Oral, resp. rate (!) 22, height 5\' 6"  (1.676 m), weight 73 kg, SpO2 93 %.  GENERAL:  72 y.o.-year-old patient lying in the bed with no acute distress.  He appears acutely ill, but nontoxic. EYES: Pupils equal, round, reactive to light and accommodation. No scleral icterus. Extraocular muscles intact.  HEENT: Head atraumatic, normocephalic. Oropharynx and nasopharynx clear.  NECK:  Supple, no jugular venous distention. No thyroid enlargement, no tenderness.  LUNGS: Normal breath sounds bilaterally, no wheezing, rales,rhonchi or crepitation. No use  of accessory muscles of respiration.  CARDIOVASCULAR: S1, S2 normal. No S3/S4.  ABDOMEN: Soft, nontender, nondistended. Bowel sounds present. No organomegaly or mass.  EXTREMITIES: No pedal edema, cyanosis, or clubbing.  NEUROLOGIC: Cranial nerves II through XII are intact. Muscle strength 5/5 in all extremities. Sensation intact.   PSYCHIATRIC: The patient is alert and oriented x 3.  SKIN: No obvious rash, lesion, or ulcer.   LABORATORY PANEL:   CBC Recent Labs  Lab 12/12/17 1724 12/14/17 0417 12/15/17 0402 12/17/17 2319  WBC 7.9 8.6 9.9 12.4*  HGB 13.9 14.6 14.2 13.9  HCT 42.7 44.9 43.3 42.4  PLT 341 367 368 425*  MCV 93.4 93.2 92.3 92.4  MCH 30.4 30.3 30.3 30.3  MCHC 32.6 32.5 32.8 32.8  RDW 13.1 12.8 13.2 13.2   ------------------------------------------------------------------------------------------------------------------  Chemistries  Recent Labs  Lab 12/12/17 1724 12/15/17 0402 12/17/17 2319  NA 140 140 139  K 4.2 3.9 4.1  CL 104 104 106  CO2 28 27 24   GLUCOSE 237* 116* 164*  BUN 19 16 24*  CREATININE  0.83 0.76 0.87  CALCIUM 9.0 9.2 9.6   ------------------------------------------------------------------------------------------------------------------ estimated creatinine clearance is 68.2 mL/min (by C-G formula based on SCr of 0.87 mg/dL). ------------------------------------------------------------------------------------------------------------------ No results for input(s): TSH, T4TOTAL, T3FREE, THYROIDAB in the last 72 hours.  Invalid input(s): FREET3   Coagulation profile Recent Labs  Lab 12/13/17 0353  INR 0.98   ------------------------------------------------------------------------------------------------------------------- No results for input(s): DDIMER in the last 72 hours. -------------------------------------------------------------------------------------------------------------------  Cardiac Enzymes Recent Labs  Lab  12/13/17 0115 12/13/17 0353 12/17/17 2319 12/17/17 2353  CKMB  --   --   --  2.6  TROPONINI 0.29* 0.22* 0.16*  --    ------------------------------------------------------------------------------------------------------------------ Invalid input(s): POCBNP  ---------------------------------------------------------------------------------------------------------------  Urinalysis No results found for: COLORURINE, APPEARANCEUR, LABSPEC, PHURINE, GLUCOSEU, HGBUR, BILIRUBINUR, KETONESUR, PROTEINUR, UROBILINOGEN, NITRITE, LEUKOCYTESUR   RADIOLOGY: Dg Chest 2 View  Result Date: 12/17/2017 CLINICAL DATA:  Chest pain, diaphoresis EXAM: CHEST - 2 VIEW COMPARISON:  12/12/2017 FINDINGS: There is hyperinflation of the lungs compatible with COPD. Heart is normal size. Linear scarring in the lung bases. Rounded calcified nodule at the left lung base compatible with granuloma. Left upper lobe granuloma also noted. No effusions. No acute bony abnormality. IMPRESSION: COPD/chronic changes. Old granulomatous disease. No active disease. Electronically Signed   By: Charlett Nose M.D.   On: 12/17/2017 23:29    EKG: Orders placed or performed during the hospital encounter of 12/17/17  . ED EKG within 10 minutes  . ED EKG within 10 minutes  . Repeat EKG  . Repeat EKG  . EKG 12-Lead immediately post procedure  . EKG 12-Lead  . EKG 12-Lead immediately post procedure  . EKG 12-Lead    IMPRESSION AND PLAN:  1. STEMI, secondary to occlusion of recent stents, most likely due to noncompliance with Brilinta.  Patient is status post angioplasty with clot extraction.  Currently, doing well, chest pain-free. Patient is transferred to intensive care unit for further monitoring.  Continue medications per cardiology. 2.  Diabetes type 2.  Continue to monitor blood sugars before meals and at bedtime and use insulin treatment during the hospital stay.  We will hold p.o. meds, while in hospital. 3.  Hypertension,  uncontrolled likely secondary to noncompliance with medications.  Will restart home medications and continue to monitor blood pressure closely.  Patient was again advised on the importance of being compliant with his medications. 4.  Hyperlipidemia, on statin.  All the records are reviewed and case discussed with ED Cardiology and ICU providers. Management plans discussed with the patient, family and they are in agreement.  CODE STATUS: Full    Code Status Orders  (From admission, onward)         Start     Ordered   12/18/17 0137  Full code  Continuous     12/18/17 0136        Code Status History    Date Active Date Inactive Code Status Order ID Comments User Context   12/12/2017 1919 12/15/2017 1651 Full Code [id number]  Ramonita Lab, MD ED    Advance Directive Documentation     Most Recent Value  Type of Advance Directive  Living will  Pre-existing out of facility DNR order (yellow form or pink MOST form)  -  "MOST" Form in Place?  -       TOTAL TIME TAKING CARE OF THIS PATIENT: 50 minutes.    Cammy Copa M.D on 12/18/2017 at 1:58 AM  Between 7am to 6pm - Pager - [phone]  After 6pm go to  [email or web address] - password [email or web address] [facility] [place] at [facility]  [phone]  CC: Primary care physician; Lauro Regulus, MD

## 2017-12-18 NOTE — Progress Notes (Signed)
MEDICATION RELATED CONSULT NOTE - INITIAL   Pharmacy Consult for tirofiban Indication: post-cath s/p STEMI  No Known Allergies  Patient Measurements: Height: 5\' 6"  (167.6 cm) Weight: 160 lb 15 oz (73 kg) IBW/kg (Calculated) : 63.8  Vital Signs: Temp: 98.4 F (36.9 C) (10/24 2312) Temp Source: Oral (10/24 2312) BP: 182/96 (10/25 0010) Pulse Rate: 98 (10/25 0010) Intake/Output from previous day: No intake/output data recorded. Intake/Output from this shift: No intake/output data recorded.  Labs: Recent Labs    12/15/17 0402 12/17/17 2319  WBC 9.9 12.4*  HGB 14.2 13.9  HCT 43.3 42.4  PLT 368 425*  CREATININE 0.76 0.87   Estimated Creatinine Clearance: 68.2 mL/min (by C-G formula based on SCr of 0.87 mg/dL).   Microbiology: No results found for this or any previous visit (from the past 720 hour(s)).  Medical History: Past Medical History:  Diagnosis Date  . Chronic back pain   . Diabetes mellitus without complication (HCC)   . Hyperlipidemia   . Hypertension   . Myocardial infarction [facility]) 2007, 2009  . Peripheral neuropathy     Medications:  Scheduled:  . aspirin  81 mg Oral Daily  . sodium chloride flush  3 mL Intravenous Q12H  . ticagrelor  90 mg Oral BID    Assessment: Patient admitted for STEMI post cath, cardiologist wants to continue aggrastat for 24 hours.  Plan:  Will continue aggrastat for 24 hours beginning 10/25 @ 0145 unitl 10/26 @ 0145. Patient received bolus of 25 mcg/kg in cath lab suite. Current infusion rate running at 0.15 mcg/kg/min, appropriate per CrCl > 60 ml/min. Will continue to monitor CBC and platelets and s/sx of bleeding.  Thomasene Ripple, PharmD, BCPS Clinical Pharmacist 12/18/2017

## 2017-12-18 NOTE — Care Management Note (Signed)
Case Management Note  Patient Details  Name: Gregory Mendoza MRN: [id number] Date of Birth: January 06, 1946  Subjective/Objective:      RNCM to see patient in ICU.  Patient admitted for STEMI.  He was recently discharged 10/22 for NSTEMI and left heart cath with stent placement.  Pt went home with prescription for Brilenta but did not start taking the medication.  RNCM asked the patient why he did not get the prescription and he reports that he didn't know it was that important, he states he takes aspirin everyday.  He also reports that since this admission he has had multiple providers tell him how important taking this medication is.  He also states though that he will not be homeless because of this medication- so in other words if it is too expensive he will not take it.  He reports that he is not currently in the coverage gap with Medicare but he has been in the past.  He states he took Plavix in the past and it became too expensive.  He recently just started taking insulin.  The insulin is very expensive with [employer] but he is able to get it for $49 from [place].  He was given a coupon for 30 day free last admission and he still has that- he will get his wife to get the prescription picked up before his discharge.   Patient agreed for me to place a [facility] referral for outpatient case management.  Patient reports he lives at home with his wife, he drives, she does not.  He has a PCP Dr. Dareen Piano that he saw last month and follows up with every 3-4 months.  He gets his prescriptions from Mail order through [facility], and uses [facility] for his insulin.  Reports he is independent in all ADL's and has never needed [facility] or rehab.  Will cont to follow.                Action/Plan:   Expected Discharge Date:  12/20/17               Expected Discharge Plan:  Home/Self Care  In-House Referral:     Discharge planning Services  CM Consult  Post Acute Care Choice:    Choice offered to:     DME Arranged:    DME Agency:      HH Arranged:    HH Agency:     Status of Service:  In process, will continue to follow  If discussed at Long Length of Stay Meetings, dates discussed:    Additional Comments:  Allayne Butcher, RN 12/18/2017, 9:53 AM

## 2017-12-18 NOTE — Patient Outreach (Addendum)
[facility]) Care Management  12/18/2017  Roark Holik Oct 10, 1945 [id number]   EMMI: general discharge Referral date: 12/18/17 Referral reason: unfilled prescriptions Insurance: [employer] Day # 1 Attempt #1   Telephone call to patient regarding referral. Unable to reach patient or leave voice message.  Phone only rang/ no answer.   PLAN: RNCM will attempt 2nd telephone call to patient within 4 business days. RNCM will send outreach letter.   George Ina RN,BSN, CCM [facility] Telephonic  [phone]

## 2017-12-18 NOTE — Consult Note (Signed)
Name: Gregory Mendoza MRN: [id number] DOB: 28-Apr-1945    ADMISSION DATE:  12/17/2017 CONSULTATION DATE:  12/18/2017  REFERRING MD :  Dr. Juliann Pares  CHIEF COMPLAINT:  Chest pain, STEMI  BRIEF PATIENT DESCRIPTION:  72 y.o. Male admitted with STEMI, underwent emergent Cardiac catheterization.  Pt noted to have thrombosis of previous stents that were placed on 12/14/17. Successful revascularization of previous stents by means of angioplasty and clot extraction.  No additional stents required.  Occlusion attributed to medical noncompliance, as  pt did not fill his Brilinta upon previous discharge.  Admitted to ICU post cath, to remain on Aggrastat infusion for 24 hrs per Cardiology.  SIGNIFICANT EVENTS  12/17/17>>Presented to [facility] ED, ruled in for STEMI, taken emergently to cardiac catheterization 10/525/19>> Admission to [facility] ICU post cardiac cath  STUDIES:  12/18/17 Cardiac catheterization>>   HISTORY OF PRESENT ILLNESS:   Mr. Gregory Mendoza is a 72 y.o. Male with a PMH as listed below who presents to [facility] ED on 12/17/17 with c/o chest pain radiating to his neck and jaw, and diaphoresis.  Pt was admitted recently on 12/14/17 for chest pain in which he received 2 cardiac stents.  Pt admits that he never filled his Brilinta after being discharged post stent placement.  EKG obtained on 10/24 revealed ST Elevation in the inferior leads.  Troponin was 0.16, and CK MB 2.6. CXR negative for acute disease, chronic COPD evident. CODE STEMI was called, and Dr. Juliann Pares with Cardiology took pt emergently to the cath lab. Cardiac cath revealed thrombosis of his previous stents due his medical noncompliance, of which successful revascularization to previous stents was obtained by means of  balloon angioplasty and clot extraction.  No additional stents were required.  He presents to the ICU post cardiac catheterization, to remain on Aggrastat infusion for 24 hours per Cardiology.  PCCM is consulted for further medical  management.    PAST MEDICAL HISTORY :   has a past medical history of Chronic back pain, Diabetes mellitus without complication (HCC), Hyperlipidemia, Hypertension, Myocardial infarction (HCC) (2007, 2009), and Peripheral neuropathy.  has a past surgical history that includes Back surgery (1997); Colonoscopy (2017); Appendectomy (1970's); Hernia repair (Left, 02/08/2008); Inguinal hernia repair (Right, 1999); LEFT HEART CATH AND CORONARY ANGIOGRAPHY (N/A, 12/14/2017); and CORONARY STENT INTERVENTION (N/A, 12/14/2017). Prior to Admission medications   Medication Sig Start Date End Date Taking? Authorizing Provider  acetaminophen (TYLENOL) 500 MG tablet Take 500-1,000 mg by mouth every 6 (six) hours as needed for pain. 12/04/17  Yes [provider]  aspirin 81 MG chewable tablet Chew 81 mg by mouth daily.    Yes [provider]  atorvastatin (LIPITOR) 80 MG tablet Take 1 tablet (80 mg total) by mouth daily. 12/16/17 01/15/18 Yes Pyreddy, Vivien Rota, MD  diclofenac sodium (VOLTAREN) 1 % GEL Apply 4 g topically 4 (four) times daily.   Yes [provider]  gabapentin (NEURONTIN) 600 MG tablet Take 600 mg by mouth 3 (three) times daily.    Yes [provider]  glimepiride (AMARYL) 4 MG tablet Take 4 mg by mouth daily with breakfast.    Yes [provider]  insulin NPH Human (HUMULIN N,NOVOLIN N) 100 UNIT/ML injection Inject 15 Units into the skin 2 (two) times daily before a meal.   Yes [provider]  isosorbide mononitrate (IMDUR) 30 MG 24 hr tablet Take 30 mg by mouth daily.   Yes [provider]  lisinopril (PRINIVIL,ZESTRIL) 10 MG tablet Take 10 mg by mouth daily.  Yes [provider]  metFORMIN (GLUCOPHAGE) 1000 MG tablet Take 1,000 mg by mouth 2 (two) times daily with a meal.    Yes [provider]  metoprolol (LOPRESSOR) 50 MG tablet Take 50 mg by mouth 2 (two) times daily.    Yes [provider]  Multiple  Vitamins-Minerals (MULTIVITAMIN ADULT) TABS Take 1 tablet by mouth daily. 12/04/17  Yes [provider]  pioglitazone (ACTOS) 30 MG tablet Take 30 mg by mouth daily.    Yes [provider]  REDNESS RELIEVER EYE DROPS 0.05 % ophthalmic solution Place 1 drop into both eyes as directed. 12/04/17  Yes [provider]  ticagrelor (BRILINTA) 90 MG TABS tablet Take 1 tablet (90 mg total) by mouth 2 (two) times daily. 12/15/17 01/14/18 Yes Pyreddy, Vivien Rota, MD   No Known Allergies  FAMILY HISTORY:  family history is not on file. SOCIAL HISTORY:  reports that he quit smoking about 19 years ago. He quit after 40.00 years of use. He has never used smokeless tobacco. He reports that he does not drink alcohol or use drugs.  REVIEW OF SYSTEMS:  Positives in BOLD  Pt denies all complaints currently Constitutional: Negative for fever, chills, weight loss, malaise/fatigue and diaphoresis.  HENT: Negative for hearing loss, ear pain, nosebleeds, congestion, sore throat, neck pain, tinnitus and ear discharge.   Eyes: Negative for blurred vision, double vision, photophobia, pain, discharge and redness.  Respiratory: Negative for cough, hemoptysis, sputum production, shortness of breath, wheezing and stridor.   Cardiovascular: Negative for chest pain, palpitations, orthopnea, claudication, leg swelling and PND.  Gastrointestinal: Negative for heartburn, nausea, vomiting, abdominal pain, diarrhea, constipation, blood in stool and melena.  Genitourinary: Negative for dysuria, urgency, frequency, hematuria and flank pain.  Musculoskeletal: Negative for myalgias, back pain, joint pain and falls.  Skin: Negative for itching and rash.  Neurological: Negative for dizziness, tingling, tremors, sensory change, speech change, focal weakness, seizures, loss of consciousness, weakness and headaches.  Endo/Heme/Allergies: Negative for environmental allergies and polydipsia. Does not bruise/bleed  easily.  SUBJECTIVE:  Pt denies chest pain, diaphoresis, shortness of breath, palpitations, or dizziness Denies fever, chills, or abdominal pain Has no current complaints, reports he is "feeling much better"  VITAL SIGNS: Temp:  [98.4 F (36.9 C)] 98.4 F (36.9 C) (10/24 2312) Pulse Rate:  [92-98] 98 (10/25 0010) Resp:  [16-24] 22 (10/25 0010) BP: (118-191)/(85-100) 182/96 (10/25 0010) SpO2:  [92 %-98 %] 93 % (10/25 0010) Weight:  [72.8 kg-73 kg] 73 kg (10/24 2312)  PHYSICAL EXAMINATION: General:  Acutely ill appearing male, laying in bed, in NAD Neuro:  Awake, A&O x4, follows commands, no focal deficits, speech clear HEENT:  Atraumatic, normocephalic, neck supple, no JVD Cardiovascular:  Tachycardia, regular rhythm, s1s2, no M/R/G, 2+ pulses throughout Lungs:  Clear bilaterally upon auscultation, no wheezing, even, nonlabored, no assessory use, normal effort Abdomen:  Soft, nontender, nondistended, BS+ x4 Musculoskeletal:  No deformities, normal bulk and tone, no edema Skin:  Warm,dry.  No obvious rashes, lesions, or ulcerations  Recent Labs  Lab 12/12/17 1724 12/15/17 0402 12/17/17 2319  NA 140 140 139  K 4.2 3.9 4.1  CL 104 104 106  CO2 28 27 24   BUN 19 16 24*  CREATININE 0.83 0.76 0.87  GLUCOSE 237* 116* 164*   Recent Labs  Lab 12/14/17 0417 12/15/17 0402 12/17/17 2319  HGB 14.6 14.2 13.9  HCT 44.9 43.3 42.4  WBC 8.6 9.9 12.4*  PLT 367 368 425*   Dg Chest  2 View  Result Date: 12/17/2017 CLINICAL DATA:  Chest pain, diaphoresis EXAM: CHEST - 2 VIEW COMPARISON:  12/12/2017 FINDINGS: There is hyperinflation of the lungs compatible with COPD. Heart is normal size. Linear scarring in the lung bases. Rounded calcified nodule at the left lung base compatible with granuloma. Left upper lobe granuloma also noted. No effusions. No acute bony abnormality. IMPRESSION: COPD/chronic changes. Old granulomatous disease. No active disease. Electronically Signed   By: Charlett Nose M.D.   On: 12/17/2017 23:29    ASSESSMENT / PLAN:  CARDIOVASCULAR A: STEMI Occlusion of previous stents secondary to noncompliance of Brilinta Hx: MI, HTN, HLD P: Cardiac monitoring Status cardiac catheterization with revascularization of previous stents Cardiology following, appreciate input, will follow recommendations To remain on Aggrastat infusion for 24 hrs per Cardiology To remain on bedrest per Cardiology recommendations Monitor right femoral site for bleeding/hematoma, and monitor for distal pulses Continue Brilinta and Aspirin Encourage Brilinta compliance Trend Troponin Maintain MAP >65 Continue home Imdur, Lisinopril, and Metoprolol  RENAL A: No acute issues P: Monitor I&O's / urinary output Follow BMP given contrast with Cardiac cath Ensure adequate renal perfusion Avoid nephrotoxic agents as able Replace electrolytes as indicated IVF: NS  PULMONARY A: No acute issues Hx: COPD P: Supplemental O2 as needed to maintain O2 sats 88 to 94% Follow intermittent CXR as needed  ENDOCRINE A: DM II P: CBG's  SSI Follow ICU Hypo/hyperglycemia protocol Hold home metformin, actos, and glimepiride    DISPOSITION: ICU GOALS OF CARE: Full Code VTE PROPHYLAXIS: Brilinta, Aggrastat infusion UPDATES: Updated pt, his wife, and his daughter at bedside 10/25.     Harlon Ditty, AGACNP-BC [facility] Pager: [phone]   12/18/2017, 12:34 AM

## 2017-12-18 NOTE — CV Procedure (Signed)
STEMI presentation acutely with inferior changes.  Patient gives a history of previous recent stress PCI and stent with DES earlier this week on Monday patient was discharged home and was instructed to take aspirin and Brilinta but the patient did not fill his Brilinta he woke up this morning with substernal severe chest pain 9-10 he took 2 nitroglycerin without relief and rescue was called who then picked him up EKG by rescue suggested ST elevation inferiorly so he was brought to the emergency room and STEMI code was called.  In the emergency room the patient was given narcotics heparin aspirin I instructed him to give him a load of Brilinta.  Patient was then brought to cardiac Cath Lab for emergent intervention for what appeared to be stent thrombosis because of noncompliance with medication.. In the Cath Lab we performed the procedure from the right femoral area with a 6 [place] guide BMW wire we initially started with Angiomax and switched to Aggrastat we ballooned and stent proximal thrombosis we also used a suction device catheter for clot extraction did not place any additional stents and we maintain the patient on Aggrastat for 24 hours to help with anticoagulation and to clean up any residual clot patient was restarted on aspirin and Brilinta.  Patient had a minor mild amount of oozing from his right groin and a PAD was deployed after a minx.  Patient was then transferred to ICU for further recovery Case was discussed with hospitalist Dr. Caryn Bee.  Anticipate the patient to be ready for discharge 72 to 96 hours will follow up cardiac enzymes and troponins left system was unchanged LV function was preserved with at least 55%. Conclusion successful intervention of in-stent thrombosis proximal RCA no new stents were deployed area was ballooned and suction extraction device was used Proximal RCA went from TIMI 0 to TIMI III.  Symptoms improved from 9 down to 0/10.

## 2017-12-19 LAB — CBC
HEMATOCRIT: 39.6 % (ref 39.0–52.0)
Hemoglobin: 13 g/dL (ref 13.0–17.0)
MCH: 30.4 pg (ref 26.0–34.0)
MCHC: 32.8 g/dL (ref 30.0–36.0)
MCV: 92.5 fL (ref 80.0–100.0)
NRBC: 0 % (ref 0.0–0.2)
PLATELETS: 369 10*3/uL (ref 150–400)
RBC: 4.28 MIL/uL (ref 4.22–5.81)
RDW: 13.3 % (ref 11.5–15.5)
WBC: 9.6 10*3/uL (ref 4.0–10.5)

## 2017-12-19 LAB — GLUCOSE, CAPILLARY
GLUCOSE-CAPILLARY: 145 mg/dL — AB (ref 70–99)
GLUCOSE-CAPILLARY: 159 mg/dL — AB (ref 70–99)
Glucose-Capillary: 158 mg/dL — ABNORMAL HIGH (ref 70–99)
Glucose-Capillary: 183 mg/dL — ABNORMAL HIGH (ref 70–99)

## 2017-12-19 LAB — BASIC METABOLIC PANEL
ANION GAP: 10 (ref 5–15)
BUN: 17 mg/dL (ref 8–23)
CALCIUM: 9 mg/dL (ref 8.9–10.3)
CO2: 24 mmol/L (ref 22–32)
CREATININE: 0.89 mg/dL (ref 0.61–1.24)
Chloride: 107 mmol/L (ref 98–111)
Glucose, Bld: 159 mg/dL — ABNORMAL HIGH (ref 70–99)
Potassium: 4.1 mmol/L (ref 3.5–5.1)
SODIUM: 141 mmol/L (ref 135–145)

## 2017-12-19 LAB — MYOGLOBIN, SERUM: Myoglobin: 61 ng/mL (ref 28–72)

## 2017-12-19 NOTE — Progress Notes (Signed)
[facility] [place] [facility]   PATIENT NAME: Gregory Mendoza    MR#:  [id number]  DATE OF BIRTH:  [birth year]  SUBJECTIVE:  Patient much improved, and discussion with intensivist-for floor placement possibly later today  REVIEW OF SYSTEMS:  CONSTITUTIONAL: No fever, fatigue or weakness.  EYES: No blurred or double vision.  EARS, NOSE, AND THROAT: No tinnitus or ear pain.  RESPIRATORY: No cough, shortness of breath, wheezing or hemoptysis.  CARDIOVASCULAR: No chest pain, orthopnea, edema.  GASTROINTESTINAL: No nausea, vomiting, diarrhea or abdominal pain.  GENITOURINARY: No dysuria, hematuria.  ENDOCRINE: No polyuria, nocturia,  HEMATOLOGY: No anemia, easy bruising or bleeding SKIN: No rash or lesion. MUSCULOSKELETAL: No joint pain or arthritis.   NEUROLOGIC: No tingling, numbness, weakness.  PSYCHIATRY: No anxiety or depression.   DRUG ALLERGIES:  No Known Allergies  VITALS:  Blood pressure (!) 160/70, pulse (!) 113, temperature 98.6 F (37 C), temperature source Oral, resp. rate 18, height 5\' 6"  (1.676 m), weight 73.2 kg, SpO2 97 %.  PHYSICAL EXAMINATION:  GENERAL:  72 y.o.-year-old patient lying in the bed with no acute distress.  EYES: Pupils equal, round, reactive to light and accommodation. No scleral icterus. Extraocular muscles intact.  HEENT: Head atraumatic, normocephalic. Oropharynx and nasopharynx clear.  NECK:  Supple, no jugular venous distention. No thyroid enlargement, no tenderness.  LUNGS: Normal breath sounds bilaterally, no wheezing, rales,rhonchi or crepitation. No use of accessory muscles of respiration.  CARDIOVASCULAR: S1, S2 normal. No murmurs, rubs, or gallops.  ABDOMEN: Soft, nontender, nondistended. Bowel sounds present. No organomegaly or mass.  EXTREMITIES: No pedal edema, cyanosis, or clubbing.  NEUROLOGIC: Cranial nerves II through XII are intact. Muscle strength 5/5 in all extremities. Sensation intact. Gait not  checked.  PSYCHIATRIC: The patient is alert and oriented x 3.  SKIN: No obvious rash, lesion, or ulcer.    LABORATORY PANEL:   CBC Recent Labs  Lab 12/19/17 0538  WBC 9.6  HGB 13.0  HCT 39.6  PLT 369   ------------------------------------------------------------------------------------------------------------------  Chemistries  Recent Labs  Lab 12/19/17 0538  NA 141  K 4.1  CL 107  CO2 24  GLUCOSE 159*  BUN 17  CREATININE 0.89  CALCIUM 9.0   ------------------------------------------------------------------------------------------------------------------  Cardiac Enzymes Recent Labs  Lab 12/18/17 1427  TROPONINI 9.89*   ------------------------------------------------------------------------------------------------------------------  RADIOLOGY:  Dg Chest 2 View  Result Date: 12/17/2017 CLINICAL DATA:  Chest pain, diaphoresis EXAM: CHEST - 2 VIEW COMPARISON:  12/12/2017 FINDINGS: There is hyperinflation of the lungs compatible with COPD. Heart is normal size. Linear scarring in the lung bases. Rounded calcified nodule at the left lung base compatible with granuloma. Left upper lobe granuloma also noted. No effusions. No acute bony abnormality. IMPRESSION: COPD/chronic changes. Old granulomatous disease. No active disease. Electronically Signed   By: Charlett Nose M.D.   On: 12/17/2017 23:29    EKG:   Orders placed or performed during the hospital encounter of 12/17/17  . ED EKG within 10 minutes  . ED EKG within 10 minutes  . Repeat EKG  . Repeat EKG  . EKG 12-Lead  . EKG 12-Lead  . EKG    ASSESSMENT AND PLAN:   1.  Acute STEMI Resolving secondary to occlusion of recent stents due to noncompliance with [facility] Cardiology input appreciated, continue  DAPT with aspirin/Brilinta Cardiology input appreciated- status post angioplasty with clot extraction  2.  Diabetes type 2 Controlled on current regiment  3.  Hypertension Stable-continue lisinopril,  Lopressor  4.  Hyperlipidemia Stable on statin.   All the records are reviewed and case discussed with Care Management/Social Workerr. Management plans discussed with the patient, family and they are in agreement.  CODE STATUS: full code  TOTAL TIME TAKING CARE OF THIS PATIENT: 35 minutes.   POSSIBLE D/C IN 1-2DAYS, DEPENDING ON CLINICAL CONDITION.  Note: This dictation was prepared with Dragon dictation along with smaller phrase technology. Any transcriptional errors that result from this process are unintentional.   Evelena Asa Salary M.D on 12/19/2017 at 12:53 PM  Between 7am to 6pm - Pager - [phone] After 6pm go to [email or web address] - password [email or web address] [facility]  [place] [facility]  Office  [phone]  CC: Primary care physician; Lauro Regulus, MD

## 2017-12-19 NOTE — Plan of Care (Signed)
  Problem: Education: Goal: Knowledge of General Education information will improve Description Including pain rating scale, medication(s)/side effects and non-pharmacologic comfort measures 12/19/2017 2345 by Myles Gip, RN Outcome: Progressing 12/19/2017 2344 by Myles Gip, RN Outcome: Progressing   Problem: Health Behavior/Discharge Planning: Goal: Ability to manage health-related needs will improve 12/19/2017 2345 by Myles Gip, RN Outcome: Progressing 12/19/2017 2344 by Myles Gip, RN Outcome: Progressing   Problem: Clinical Measurements: Goal: Diagnostic test results will improve Outcome: Progressing   Problem: Pain Managment: Goal: General experience of comfort will improve 12/19/2017 2345 by Myles Gip, RN Outcome: Progressing 12/19/2017 2344 by Myles Gip, RN Outcome: Progressing

## 2017-12-19 NOTE — Progress Notes (Signed)
Pt blood sugar is 183 as per tech tiana. Glucometer not sinking yet. Will continue to monitor.

## 2017-12-20 LAB — GLUCOSE, CAPILLARY
GLUCOSE-CAPILLARY: 181 mg/dL — AB (ref 70–99)
Glucose-Capillary: 275 mg/dL — ABNORMAL HIGH (ref 70–99)

## 2017-12-20 NOTE — Plan of Care (Signed)
  Problem: Education: Goal: Knowledge of General Education information will improve Description: Including pain rating scale, medication(s)/side effects and non-pharmacologic comfort measures Outcome: Completed/Met   Problem: Clinical Measurements: Goal: Will remain free from infection Outcome: Completed/Met   

## 2017-12-20 NOTE — Discharge Summary (Signed)
[facility] - [place] at [facility]   PATIENT NAME: Gregory Mendoza    MR#:  [id number]  DATE OF BIRTH:  1946/02/01  DATE OF ADMISSION:  12/17/2017 ADMITTING PHYSICIAN: Alwyn Pea, MD  DATE OF DISCHARGE: No discharge date for patient encounter.  PRIMARY CARE PHYSICIAN: Lauro Regulus, MD    ADMISSION DIAGNOSIS:  ST elevation myocardial infarction involving right coronary artery (HCC) [I21.11] STEMI involving right coronary artery (HCC) [I21.11]  DISCHARGE DIAGNOSIS:  Active Problems:   STEMI involving right coronary artery (HCC)   SECONDARY DIAGNOSIS:   Past Medical History:  Diagnosis Date  . Chronic back pain   . Diabetes mellitus without complication (HCC)   . Hyperlipidemia   . Hypertension   . Myocardial infarction [facility]) 2007, 2009  . Peripheral neuropathy     HOSPITAL COURSE:    1.  Acute STEMI Resolved Secondary to occlusion of recent stents due to noncompliance with [facility] Cardiology input appreciated- status post angioplasty with clot extraction Cardiology did see patient while in house, continue  DAPT with aspirin/Brilinta  2.Diabetes type 2 Controlled on current regiment  3.Hypertension Stable continue lisinopril, Lopressor   4.Hyperlipidemia Stable on statin rx  DISCHARGE CONDITIONS:   stable  CONSULTS OBTAINED:  Treatment Team:  Cammy Copa, MD  DRUG ALLERGIES:  No Known Allergies  DISCHARGE MEDICATIONS:   Allergies as of 12/20/2017   No Known Allergies     Medication List    TAKE these medications   acetaminophen 500 MG tablet Commonly known as:  TYLENOL Take 500-1,000 mg by mouth every 6 (six) hours as needed for pain.   aspirin 81 MG chewable tablet Chew 81 mg by mouth daily.   atorvastatin 80 MG tablet Commonly known as:  LIPITOR Take 1 tablet (80 mg total) by mouth daily.   diclofenac sodium 1 % Gel Commonly known as:  VOLTAREN Apply 4 g topically 4 (four) times  daily.   gabapentin 600 MG tablet Commonly known as:  NEURONTIN Take 600 mg by mouth 3 (three) times daily.   glimepiride 4 MG tablet Commonly known as:  AMARYL Take 4 mg by mouth daily with breakfast.   insulin NPH Human 100 UNIT/ML injection Commonly known as:  HUMULIN N,NOVOLIN N Inject 15 Units into the skin 2 (two) times daily before a meal.   isosorbide mononitrate 30 MG 24 hr tablet Commonly known as:  IMDUR Take 30 mg by mouth daily.   lisinopril 10 MG tablet Commonly known as:  PRINIVIL,ZESTRIL Take 10 mg by mouth daily.   metFORMIN 1000 MG tablet Commonly known as:  GLUCOPHAGE Take 1,000 mg by mouth 2 (two) times daily with a meal.   metoprolol tartrate 50 MG tablet Commonly known as:  LOPRESSOR Take 50 mg by mouth 2 (two) times daily.   MULTIVITAMIN ADULT Tabs Take 1 tablet by mouth daily.   pioglitazone 30 MG tablet Commonly known as:  ACTOS Take 30 mg by mouth daily.   REDNESS RELIEVER EYE DROPS 0.05 % ophthalmic solution Generic drug:  tetrahydrozoline Place 1 drop into both eyes as directed.   ticagrelor 90 MG Tabs tablet Commonly known as:  BRILINTA Take 1 tablet (90 mg total) by mouth 2 (two) times daily.        DISCHARGE INSTRUCTIONS:  If you experience worsening of your admission symptoms, develop shortness of breath, life threatening emergency, suicidal or homicidal thoughts you must seek medical attention immediately by calling 911 or calling your MD immediately  if  symptoms less severe.  You Must read complete instructions/literature along with all the possible adverse reactions/side effects for all the Medicines you take and that have been prescribed to you. Take any new Medicines after you have completely understood and accept all the possible adverse reactions/side effects.   Please note  You were cared for by a hospitalist during your hospital stay. If you have any questions about your discharge medications or the care you received  while you were in the hospital after you are discharged, you can call the unit and asked to speak with the hospitalist on call if the hospitalist that took care of you is not available. Once you are discharged, your primary care physician will handle any further medical issues. Please note that NO REFILLS for any discharge medications will be authorized once you are discharged, as it is imperative that you return to your primary care physician (or establish a relationship with a primary care physician if you do not have one) for your aftercare needs so that they can reassess your need for medications and monitor your lab values.    Today   CHIEF COMPLAINT:   Chief Complaint  Patient presents with  . Chest Pain    HISTORY OF PRESENT ILLNESS:  72 y.o. male with a known history of diabetes type 2, chronic back pain and CAD status post stent placement just few days ago, on 12/14/2017. Patient presented to emergency room for acute onset of severe retrosternal chest pain, radiating to his neck and jaw.  Patient admits that he did not start Brilinta after being discharged from the hospital 3 days ago. Blood test done emergency room showed elevated troponin level is 0.16. EKG shows ST elevation in the inferior leads. Chest x-ray chronic COPD, no acute abnormalities. Code STEMI was called in the emergency room and patient was taken emergently to the Cath Lab.  Cardiac cath revealed thrombosis of his previous stents.  Balloon angioplasty and clot extraction were performed, no additional stents needed.  Patient is currently chest pain-free, status post procedure. Patient was transferred to intensive care unit after the procedure, for further close monitoring. VITAL SIGNS:  Blood pressure 110/72, pulse (!) 114, temperature 97.7 F (36.5 C), temperature source Oral, resp. rate 18, height 5\' 6"  (1.676 m), weight 72 kg, SpO2 96 %.  I/O:    Intake/Output Summary (Last 24 hours) at 12/20/2017 1025 Last  data filed at 12/20/2017 0554 Gross per 24 hour  Intake 600 ml  Output 2270 ml  Net -1670 ml    PHYSICAL EXAMINATION:  GENERAL:  72 y.o.-year-old patient lying in the bed with no acute distress.  EYES: Pupils equal, round, reactive to light and accommodation. No scleral icterus. Extraocular muscles intact.  HEENT: Head atraumatic, normocephalic. Oropharynx and nasopharynx clear.  NECK:  Supple, no jugular venous distention. No thyroid enlargement, no tenderness.  LUNGS: Normal breath sounds bilaterally, no wheezing, rales,rhonchi or crepitation. No use of accessory muscles of respiration.  CARDIOVASCULAR: S1, S2 normal. No murmurs, rubs, or gallops.  ABDOMEN: Soft, non-tender, non-distended. Bowel sounds present. No organomegaly or mass.  EXTREMITIES: No pedal edema, cyanosis, or clubbing.  NEUROLOGIC: Cranial nerves II through XII are intact. Muscle strength 5/5 in all extremities. Sensation intact. Gait not checked.  PSYCHIATRIC: The patient is alert and oriented x 3.  SKIN: No obvious rash, lesion, or ulcer.   DATA REVIEW:   CBC Recent Labs  Lab 12/19/17 0538  WBC 9.6  HGB 13.0  HCT 39.6  PLT  369    Chemistries  Recent Labs  Lab 12/19/17 0538  NA 141  K 4.1  CL 107  CO2 24  GLUCOSE 159*  BUN 17  CREATININE 0.89  CALCIUM 9.0    Cardiac Enzymes Recent Labs  Lab 12/18/17 1427  TROPONINI 9.89*    Microbiology Results  Results for orders placed or performed during the hospital encounter of 12/17/17  MRSA PCR Screening     Status: None   Collection Time: 12/18/17  2:15 AM  Result Value Ref Range Status   MRSA by PCR NEGATIVE NEGATIVE Final    Comment:        The GeneXpert MRSA Assay (FDA approved for NASAL specimens only), is one component of a comprehensive MRSA colonization surveillance program. It is not intended to diagnose MRSA infection nor to guide or monitor treatment for MRSA infections. Performed at [facility], [address], [place], [place] [address]     RADIOLOGY:  No results found.  EKG:   Orders placed or performed during the hospital encounter of 12/17/17  . ED EKG within 10 minutes  . ED EKG within 10 minutes  . Repeat EKG  . Repeat EKG  . EKG 12-Lead  . EKG 12-Lead  . EKG      Management plans discussed with the patient, family and they are in agreement.  CODE STATUS:     Code Status Orders  (From admission, onward)         Start     Ordered   12/18/17 0233  Full code  Continuous     12/18/17 0232        Code Status History    Date Active Date Inactive Code Status Order ID Comments User Context   12/18/2017 0136 12/18/2017 0232 Full Code [id number]  Alwyn Pea, MD Inpatient   12/12/2017 1919 12/15/2017 1651 Full Code [id number]  Ramonita Lab, MD ED    Advance Directive Documentation     Most Recent Value  Type of Advance Directive  Living will  Pre-existing out of facility DNR order (yellow form or pink MOST form)  -  "MOST" Form in Place?  -      TOTAL TIME TAKING CARE OF THIS PATIENT: 40 minutes.    Evelena Asa Onica Davidovich M.D on 12/20/2017 at 10:25 AM  Between 7am to 6pm - Pager - [phone]  After 6pm go to [email or web address] - Social research officer, government  [facility]  Office  [phone]  CC: Primary care physician; Lauro Regulus, MD   Note: This dictation was prepared with Dragon dictation along with smaller phrase technology. Any transcriptional errors that result from this process are unintentional.

## 2017-12-20 NOTE — Progress Notes (Signed)
Patient is being discharge home today as per order, all medication and discharge instruction reviewed with patient. Patient to pick up prescribe medication at pharmacy after upon discharge today ,m Patient verbalized understanding of al discharge  Instruction.

## 2017-12-21 ENCOUNTER — Other Ambulatory Visit: Payer: Self-pay

## 2017-12-21 DIAGNOSIS — I2510 Atherosclerotic heart disease of native coronary artery without angina pectoris: Secondary | ICD-10-CM | POA: Diagnosis not present

## 2017-12-21 LAB — GLUCOSE, CAPILLARY: Glucose-Capillary: 172 mg/dL — ABNORMAL HIGH (ref 70–99)

## 2017-12-21 NOTE — Patient Outreach (Signed)
[facility]) Care Management  12/21/2017  Gregory Mendoza 1945/08/31 [id number]   EMMI: general discharge Referral date: 12/18/17 Referral reason: unfilled prescriptions Insurance: [employer] Day # 1  Telephone call to patient regarding EMMI general discharge red alert. HIPAA verified. Explained reason for call. Patient states he is not having any issues. He reports having all of his medications and taking them as prescribed. Patient states he has a follow up appointment with his primary MD on today and a follow up with his cardiologist on 12/29/17.  Patient states he has transportation to his appointments.   Patient denies any new symptoms. Patient reports having nitroglycerin on hand but states he has not had to take it..   Patient denies any further needs/ concerns at this time. RNCM reviewed signs of heart attack with patient.  Advised to call 911 for symptoms.  RNCM advised patient to notify MD of any changes in condition prior to scheduled appointment. RNCM provided contact name and number for 24 hour nurse advise line [phone].  RNCM verified patient aware of 911 services for urgent/ emergent needs.  PLAN; RNCM will close due to patient being assessed and having no further needs.  RNCM will send closure notification to patients primary MD.    George Ina RN,BSN,CCM [facility] Telephonic  [phone]

## 2017-12-29 DIAGNOSIS — J449 Chronic obstructive pulmonary disease, unspecified: Secondary | ICD-10-CM | POA: Diagnosis not present

## 2017-12-29 DIAGNOSIS — I214 Non-ST elevation (NSTEMI) myocardial infarction: Secondary | ICD-10-CM | POA: Diagnosis not present

## 2017-12-29 DIAGNOSIS — Z794 Long term (current) use of insulin: Secondary | ICD-10-CM | POA: Diagnosis not present

## 2017-12-29 DIAGNOSIS — I2510 Atherosclerotic heart disease of native coronary artery without angina pectoris: Secondary | ICD-10-CM | POA: Diagnosis not present

## 2017-12-29 DIAGNOSIS — I10 Essential (primary) hypertension: Secondary | ICD-10-CM | POA: Diagnosis not present

## 2017-12-29 DIAGNOSIS — I2119 ST elevation (STEMI) myocardial infarction involving other coronary artery of inferior wall: Secondary | ICD-10-CM | POA: Diagnosis not present

## 2017-12-29 DIAGNOSIS — E119 Type 2 diabetes mellitus without complications: Secondary | ICD-10-CM | POA: Diagnosis not present

## 2018-02-02 DIAGNOSIS — M19012 Primary osteoarthritis, left shoulder: Secondary | ICD-10-CM | POA: Diagnosis not present

## 2018-02-02 DIAGNOSIS — M19011 Primary osteoarthritis, right shoulder: Secondary | ICD-10-CM | POA: Diagnosis not present

## 2018-02-02 DIAGNOSIS — M25511 Pain in right shoulder: Secondary | ICD-10-CM | POA: Diagnosis not present

## 2018-02-02 DIAGNOSIS — I10 Essential (primary) hypertension: Secondary | ICD-10-CM | POA: Diagnosis not present

## 2018-02-02 DIAGNOSIS — I2510 Atherosclerotic heart disease of native coronary artery without angina pectoris: Secondary | ICD-10-CM | POA: Diagnosis not present

## 2018-02-02 DIAGNOSIS — M25512 Pain in left shoulder: Secondary | ICD-10-CM | POA: Diagnosis not present

## 2018-02-23 DIAGNOSIS — Z794 Long term (current) use of insulin: Secondary | ICD-10-CM | POA: Diagnosis not present

## 2018-02-23 DIAGNOSIS — I2510 Atherosclerotic heart disease of native coronary artery without angina pectoris: Secondary | ICD-10-CM | POA: Diagnosis not present

## 2018-02-23 DIAGNOSIS — M25512 Pain in left shoulder: Secondary | ICD-10-CM | POA: Diagnosis not present

## 2018-02-23 DIAGNOSIS — E119 Type 2 diabetes mellitus without complications: Secondary | ICD-10-CM | POA: Diagnosis not present

## 2018-02-23 DIAGNOSIS — M25511 Pain in right shoulder: Secondary | ICD-10-CM | POA: Diagnosis not present

## 2018-02-23 DIAGNOSIS — I10 Essential (primary) hypertension: Secondary | ICD-10-CM | POA: Diagnosis not present

## 2018-03-03 DIAGNOSIS — I10 Essential (primary) hypertension: Secondary | ICD-10-CM | POA: Diagnosis not present

## 2018-03-03 DIAGNOSIS — Z794 Long term (current) use of insulin: Secondary | ICD-10-CM | POA: Diagnosis not present

## 2018-03-03 DIAGNOSIS — I2510 Atherosclerotic heart disease of native coronary artery without angina pectoris: Secondary | ICD-10-CM | POA: Diagnosis not present

## 2018-03-03 DIAGNOSIS — E119 Type 2 diabetes mellitus without complications: Secondary | ICD-10-CM | POA: Diagnosis not present

## 2018-03-08 DIAGNOSIS — E119 Type 2 diabetes mellitus without complications: Secondary | ICD-10-CM | POA: Diagnosis not present

## 2018-03-08 DIAGNOSIS — J449 Chronic obstructive pulmonary disease, unspecified: Secondary | ICD-10-CM | POA: Diagnosis not present

## 2018-03-08 DIAGNOSIS — Z794 Long term (current) use of insulin: Secondary | ICD-10-CM | POA: Diagnosis not present

## 2018-03-08 DIAGNOSIS — I2510 Atherosclerotic heart disease of native coronary artery without angina pectoris: Secondary | ICD-10-CM | POA: Diagnosis not present

## 2018-03-19 DIAGNOSIS — M7581 Other shoulder lesions, right shoulder: Secondary | ICD-10-CM | POA: Diagnosis not present

## 2018-03-19 DIAGNOSIS — M7582 Other shoulder lesions, left shoulder: Secondary | ICD-10-CM | POA: Diagnosis not present

## 2018-03-19 DIAGNOSIS — M542 Cervicalgia: Secondary | ICD-10-CM | POA: Diagnosis not present

## 2018-03-19 DIAGNOSIS — M47812 Spondylosis without myelopathy or radiculopathy, cervical region: Secondary | ICD-10-CM | POA: Diagnosis not present

## 2018-03-19 DIAGNOSIS — M7501 Adhesive capsulitis of right shoulder: Secondary | ICD-10-CM | POA: Diagnosis not present

## 2018-03-19 DIAGNOSIS — M7502 Adhesive capsulitis of left shoulder: Secondary | ICD-10-CM | POA: Diagnosis not present

## 2018-03-23 ENCOUNTER — Other Ambulatory Visit: Payer: Self-pay | Admitting: Student

## 2018-03-23 DIAGNOSIS — M7581 Other shoulder lesions, right shoulder: Secondary | ICD-10-CM

## 2018-03-23 DIAGNOSIS — M7501 Adhesive capsulitis of right shoulder: Secondary | ICD-10-CM

## 2018-03-23 DIAGNOSIS — M7582 Other shoulder lesions, left shoulder: Secondary | ICD-10-CM

## 2018-03-23 DIAGNOSIS — M7502 Adhesive capsulitis of left shoulder: Principal | ICD-10-CM

## 2018-04-02 ENCOUNTER — Ambulatory Visit
Admission: RE | Admit: 2018-04-02 | Discharge: 2018-04-02 | Disposition: A | Discharge status: Home / Self Care | Payer: Medicare HMO | Source: Ambulatory Visit | Attending: Student | Admitting: Student

## 2018-04-02 DIAGNOSIS — M7581 Other shoulder lesions, right shoulder: Secondary | ICD-10-CM | POA: Diagnosis not present

## 2018-04-02 DIAGNOSIS — M7501 Adhesive capsulitis of right shoulder: Secondary | ICD-10-CM

## 2018-04-02 DIAGNOSIS — M19011 Primary osteoarthritis, right shoulder: Secondary | ICD-10-CM | POA: Diagnosis not present

## 2018-04-02 DIAGNOSIS — M7502 Adhesive capsulitis of left shoulder: Principal | ICD-10-CM

## 2018-04-02 DIAGNOSIS — M75112 Incomplete rotator cuff tear or rupture of left shoulder, not specified as traumatic: Secondary | ICD-10-CM | POA: Diagnosis not present

## 2018-04-02 DIAGNOSIS — M7582 Other shoulder lesions, left shoulder: Secondary | ICD-10-CM | POA: Diagnosis not present

## 2018-04-26 DIAGNOSIS — M75112 Incomplete rotator cuff tear or rupture of left shoulder, not specified as traumatic: Secondary | ICD-10-CM | POA: Diagnosis not present

## 2018-04-26 DIAGNOSIS — M7582 Other shoulder lesions, left shoulder: Secondary | ICD-10-CM | POA: Insufficient documentation

## 2018-04-26 DIAGNOSIS — M7581 Other shoulder lesions, right shoulder: Secondary | ICD-10-CM | POA: Insufficient documentation

## 2018-04-26 DIAGNOSIS — M75111 Incomplete rotator cuff tear or rupture of right shoulder, not specified as traumatic: Secondary | ICD-10-CM | POA: Diagnosis not present

## 2018-05-03 DIAGNOSIS — M75111 Incomplete rotator cuff tear or rupture of right shoulder, not specified as traumatic: Secondary | ICD-10-CM | POA: Diagnosis not present

## 2018-05-03 DIAGNOSIS — M7582 Other shoulder lesions, left shoulder: Secondary | ICD-10-CM | POA: Diagnosis not present

## 2018-05-03 DIAGNOSIS — M75112 Incomplete rotator cuff tear or rupture of left shoulder, not specified as traumatic: Secondary | ICD-10-CM | POA: Diagnosis not present

## 2018-05-03 DIAGNOSIS — M7581 Other shoulder lesions, right shoulder: Secondary | ICD-10-CM | POA: Diagnosis not present

## 2018-05-10 ENCOUNTER — Encounter: Payer: Self-pay | Admitting: Emergency Medicine

## 2018-05-10 ENCOUNTER — Other Ambulatory Visit: Payer: Self-pay

## 2018-05-10 DIAGNOSIS — Z5321 Procedure and treatment not carried out due to patient leaving prior to being seen by health care provider: Secondary | ICD-10-CM | POA: Diagnosis not present

## 2018-05-10 DIAGNOSIS — R05 Cough: Secondary | ICD-10-CM | POA: Diagnosis not present

## 2018-05-10 LAB — INFLUENZA PANEL BY PCR (TYPE A & B)
INFLBPCR: NEGATIVE
Influenza A By PCR: POSITIVE — AB

## 2018-05-10 NOTE — ED Triage Notes (Addendum)
Patient ambulatory to triage with steady gait, without difficulty or distress noted, mask placed on pt; pt reports since this morning have nonprod cough and sinus drainage with fever

## 2018-05-11 ENCOUNTER — Emergency Department
Admission: EM | Admit: 2018-05-11 | Discharge: 2018-05-11 | Disposition: A | Discharge status: Home / Self Care | Payer: Medicare HMO | Attending: Emergency Medicine | Admitting: Emergency Medicine

## 2018-05-12 DIAGNOSIS — J111 Influenza due to unidentified influenza virus with other respiratory manifestations: Secondary | ICD-10-CM | POA: Diagnosis not present

## 2018-05-12 DIAGNOSIS — J449 Chronic obstructive pulmonary disease, unspecified: Secondary | ICD-10-CM | POA: Diagnosis not present

## 2018-05-12 DIAGNOSIS — R6889 Other general symptoms and signs: Secondary | ICD-10-CM | POA: Diagnosis not present

## 2018-07-02 DIAGNOSIS — Z794 Long term (current) use of insulin: Secondary | ICD-10-CM | POA: Diagnosis not present

## 2018-07-02 DIAGNOSIS — J449 Chronic obstructive pulmonary disease, unspecified: Secondary | ICD-10-CM | POA: Diagnosis not present

## 2018-07-02 DIAGNOSIS — I2510 Atherosclerotic heart disease of native coronary artery without angina pectoris: Secondary | ICD-10-CM | POA: Diagnosis not present

## 2018-07-02 DIAGNOSIS — E119 Type 2 diabetes mellitus without complications: Secondary | ICD-10-CM | POA: Diagnosis not present

## 2018-07-09 DIAGNOSIS — Z794 Long term (current) use of insulin: Secondary | ICD-10-CM | POA: Diagnosis not present

## 2018-07-09 DIAGNOSIS — I10 Essential (primary) hypertension: Secondary | ICD-10-CM | POA: Diagnosis not present

## 2018-07-09 DIAGNOSIS — E118 Type 2 diabetes mellitus with unspecified complications: Secondary | ICD-10-CM | POA: Diagnosis not present

## 2018-07-09 DIAGNOSIS — J449 Chronic obstructive pulmonary disease, unspecified: Secondary | ICD-10-CM | POA: Diagnosis not present

## 2018-07-09 DIAGNOSIS — Z0000 Encounter for general adult medical examination without abnormal findings: Secondary | ICD-10-CM | POA: Diagnosis not present

## 2018-07-09 DIAGNOSIS — I2510 Atherosclerotic heart disease of native coronary artery without angina pectoris: Secondary | ICD-10-CM | POA: Diagnosis not present

## 2018-07-23 DIAGNOSIS — I2510 Atherosclerotic heart disease of native coronary artery without angina pectoris: Secondary | ICD-10-CM | POA: Diagnosis not present

## 2018-07-23 DIAGNOSIS — J449 Chronic obstructive pulmonary disease, unspecified: Secondary | ICD-10-CM | POA: Diagnosis not present

## 2018-07-23 DIAGNOSIS — Z794 Long term (current) use of insulin: Secondary | ICD-10-CM | POA: Diagnosis not present

## 2018-07-23 DIAGNOSIS — E118 Type 2 diabetes mellitus with unspecified complications: Secondary | ICD-10-CM | POA: Diagnosis not present

## 2018-07-23 DIAGNOSIS — I10 Essential (primary) hypertension: Secondary | ICD-10-CM | POA: Diagnosis not present

## 2018-07-23 DIAGNOSIS — R55 Syncope and collapse: Secondary | ICD-10-CM | POA: Diagnosis not present

## 2018-07-29 DIAGNOSIS — Z794 Long term (current) use of insulin: Secondary | ICD-10-CM | POA: Diagnosis not present

## 2018-07-29 DIAGNOSIS — I2510 Atherosclerotic heart disease of native coronary artery without angina pectoris: Secondary | ICD-10-CM | POA: Diagnosis not present

## 2018-07-29 DIAGNOSIS — E118 Type 2 diabetes mellitus with unspecified complications: Secondary | ICD-10-CM | POA: Diagnosis not present

## 2018-07-29 DIAGNOSIS — J449 Chronic obstructive pulmonary disease, unspecified: Secondary | ICD-10-CM | POA: Diagnosis not present

## 2018-07-29 DIAGNOSIS — I10 Essential (primary) hypertension: Secondary | ICD-10-CM | POA: Diagnosis not present

## 2018-09-03 DIAGNOSIS — R319 Hematuria, unspecified: Secondary | ICD-10-CM | POA: Diagnosis not present

## 2018-09-20 DIAGNOSIS — M75112 Incomplete rotator cuff tear or rupture of left shoulder, not specified as traumatic: Secondary | ICD-10-CM | POA: Diagnosis not present

## 2018-09-20 DIAGNOSIS — M7582 Other shoulder lesions, left shoulder: Secondary | ICD-10-CM | POA: Diagnosis not present

## 2018-09-20 DIAGNOSIS — M75111 Incomplete rotator cuff tear or rupture of right shoulder, not specified as traumatic: Secondary | ICD-10-CM | POA: Diagnosis not present

## 2018-09-20 DIAGNOSIS — M7581 Other shoulder lesions, right shoulder: Secondary | ICD-10-CM | POA: Diagnosis not present

## 2018-10-25 DIAGNOSIS — Z01810 Encounter for preprocedural cardiovascular examination: Secondary | ICD-10-CM | POA: Diagnosis not present

## 2018-10-25 DIAGNOSIS — I10 Essential (primary) hypertension: Secondary | ICD-10-CM | POA: Diagnosis not present

## 2018-10-25 DIAGNOSIS — I2510 Atherosclerotic heart disease of native coronary artery without angina pectoris: Secondary | ICD-10-CM | POA: Diagnosis not present

## 2018-11-15 DIAGNOSIS — E118 Type 2 diabetes mellitus with unspecified complications: Secondary | ICD-10-CM | POA: Diagnosis not present

## 2018-11-15 DIAGNOSIS — Z794 Long term (current) use of insulin: Secondary | ICD-10-CM | POA: Diagnosis not present

## 2018-11-15 DIAGNOSIS — I2510 Atherosclerotic heart disease of native coronary artery without angina pectoris: Secondary | ICD-10-CM | POA: Diagnosis not present

## 2018-11-15 DIAGNOSIS — I10 Essential (primary) hypertension: Secondary | ICD-10-CM | POA: Diagnosis not present

## 2018-11-22 DIAGNOSIS — I10 Essential (primary) hypertension: Secondary | ICD-10-CM | POA: Diagnosis not present

## 2018-11-22 DIAGNOSIS — Z794 Long term (current) use of insulin: Secondary | ICD-10-CM | POA: Diagnosis not present

## 2018-11-22 DIAGNOSIS — Z87891 Personal history of nicotine dependence: Secondary | ICD-10-CM | POA: Diagnosis not present

## 2018-11-22 DIAGNOSIS — I2510 Atherosclerotic heart disease of native coronary artery without angina pectoris: Secondary | ICD-10-CM | POA: Diagnosis not present

## 2018-11-22 DIAGNOSIS — J449 Chronic obstructive pulmonary disease, unspecified: Secondary | ICD-10-CM | POA: Diagnosis not present

## 2018-11-22 DIAGNOSIS — E118 Type 2 diabetes mellitus with unspecified complications: Secondary | ICD-10-CM | POA: Diagnosis not present

## 2018-11-23 ENCOUNTER — Inpatient Hospital Stay: Admission: RE | Admit: 2018-11-23 | Payer: Medicare HMO | Source: Ambulatory Visit

## 2018-11-23 HISTORY — DX: Chronic obstructive pulmonary disease, unspecified: J44.9

## 2018-11-26 ENCOUNTER — Other Ambulatory Visit: Admission: RE | Admit: 2018-11-26 | Payer: Medicare HMO | Source: Ambulatory Visit

## 2018-11-30 ENCOUNTER — Ambulatory Visit: Admit: 2018-11-30 | Payer: Medicare HMO | Admitting: Surgery

## 2018-11-30 SURGERY — SHOULDER ARTHROSCOPY WITH ROTATOR CUFF REPAIR AND OPEN BICEPS TENODESIS
Anesthesia: Choice | Site: Shoulder | Laterality: Right

## 2018-12-24 DIAGNOSIS — H0102A Squamous blepharitis right eye, upper and lower eyelids: Secondary | ICD-10-CM | POA: Diagnosis not present

## 2018-12-24 DIAGNOSIS — H11823 Conjunctivochalasis, bilateral: Secondary | ICD-10-CM | POA: Diagnosis not present

## 2018-12-24 DIAGNOSIS — H0102B Squamous blepharitis left eye, upper and lower eyelids: Secondary | ICD-10-CM | POA: Diagnosis not present

## 2019-01-26 DIAGNOSIS — H10503 Unspecified blepharoconjunctivitis, bilateral: Secondary | ICD-10-CM | POA: Diagnosis not present

## 2019-02-11 DIAGNOSIS — M75111 Incomplete rotator cuff tear or rupture of right shoulder, not specified as traumatic: Secondary | ICD-10-CM | POA: Diagnosis not present

## 2019-02-11 DIAGNOSIS — M75112 Incomplete rotator cuff tear or rupture of left shoulder, not specified as traumatic: Secondary | ICD-10-CM | POA: Diagnosis not present

## 2019-02-11 DIAGNOSIS — M7582 Other shoulder lesions, left shoulder: Secondary | ICD-10-CM | POA: Diagnosis not present

## 2019-02-11 DIAGNOSIS — M7581 Other shoulder lesions, right shoulder: Secondary | ICD-10-CM | POA: Diagnosis not present

## 2019-03-04 ENCOUNTER — Other Ambulatory Visit: Payer: Self-pay | Admitting: Surgery

## 2019-03-11 ENCOUNTER — Other Ambulatory Visit: Payer: Self-pay

## 2019-03-11 ENCOUNTER — Encounter
Admission: RE | Admit: 2019-03-11 | Discharge: 2019-03-11 | Disposition: A | Discharge status: Home / Self Care | Payer: Medicare HMO | Source: Ambulatory Visit | Attending: Surgery | Admitting: Surgery

## 2019-03-11 ENCOUNTER — Encounter: Payer: Self-pay | Admitting: Surgery

## 2019-03-11 HISTORY — DX: Dyspnea, unspecified: R06.00

## 2019-03-11 HISTORY — DX: Cardiac arrhythmia, unspecified: I49.9

## 2019-03-11 NOTE — Patient Instructions (Addendum)
Your procedure is scheduled on: thurs 1/21 Report to Day Surgery.  Medical Mall To find out your arrival time please call [phone] between 1PM - 3PM on Wed. 1/20  Remember: Instructions that are not followed completely may result in serious medical risk,  up to and including death, or upon the discretion of your surgeon and anesthesiologist your  surgery may need to be rescheduled.     _X__ 1. Do not eat food after midnight the night before your procedure.                 No gum chewing or hard candies. You may drink clear liquids up to 2 hours                 before you are scheduled to arrive for your surgery- DO not drink clear                 liquids within 2 hours of the start of your surgery.                 Clear Liquids include:  water, Gatorade,  COMPLETE DRINK 2 HOURS BEFORE ARRIVAL  __X__2.  On the morning of surgery brush your teeth with toothpaste and water, you                may rinse your mouth with mouthwash if you wish.  Do not swallow any toothpaste of mouthwash.     ___ 3.  No Alcohol for 24 hours before or after surgery.   ___ 4.  Do Not Smoke or use e-cigarettes For 24 Hours Prior to Your Surgery.                 Do not use any chewable tobacco products for at least 6 hours prior to                 surgery.  ____  5.  Bring all medications with you on the day of surgery if instructed.   _x___  6.  Notify your doctor if there is any change in your medical condition      (cold, fever, infections).     Do not wear jewelry, make-up, hairpins, clips or nail polish. Do not wear lotions, powders, or perfumes. You may wear deodorant. Do not shave 48 hours prior to surgery. Men may shave face and neck. Do not bring valuables to the hospital.    [facility] is not responsible for any belongings or valuables.  Contacts, dentures or bridgework may not be worn into surgery. Leave your suitcase in the car. After surgery it may be brought to  your room. For patients admitted to the hospital, discharge time is determined by your treatment team.   Patients discharged the day of surgery will not be allowed to drive home.   Please read over the following fact sheets that you were given:    __x__ Take these medicines the morning of surgery with A SIP OF WATER:    1. acetaminophen (TYLENOL) 500 MG tablet if needed  2. gabapentin (NEURONTIN) 600 MG tablet  3. metoprolol (LOPRESSOR) 50 MG tablet  4.  5.  6.  ____ Fleet Enema (as directed)   __x__ Use CHG Soap as directed  ____ Use inhalers on the day of surgery  __x__ Stop metformin 2 days prior to surgery Last dose on 1/18   __x__ Take 1/2 of usual insulin dose the night before surgery. No insulin the morning  of surgery.   __x__ Stopped Plavix/aspirin yesterday  __x__ Stop Anti-inflammatories  No ibuprofen aleve or aspirin products     May continue tylenol   ____ Stop supplements until after surgery.    ____ Bring C-Pap to the hospital.

## 2019-03-15 ENCOUNTER — Encounter
Admission: RE | Admit: 2019-03-15 | Discharge: 2019-03-15 | Disposition: A | Discharge status: Home / Self Care | Payer: Medicare HMO | Source: Ambulatory Visit | Attending: Surgery | Admitting: Surgery

## 2019-03-15 ENCOUNTER — Other Ambulatory Visit: Admission: RE | Admit: 2019-03-15 | Payer: Medicare HMO | Source: Ambulatory Visit

## 2019-03-15 ENCOUNTER — Other Ambulatory Visit: Payer: Self-pay

## 2019-03-15 DIAGNOSIS — R9431 Abnormal electrocardiogram [ECG] [EKG]: Secondary | ICD-10-CM | POA: Diagnosis not present

## 2019-03-15 DIAGNOSIS — I252 Old myocardial infarction: Secondary | ICD-10-CM | POA: Insufficient documentation

## 2019-03-15 DIAGNOSIS — Z01818 Encounter for other preprocedural examination: Secondary | ICD-10-CM | POA: Diagnosis not present

## 2019-03-15 DIAGNOSIS — R001 Bradycardia, unspecified: Secondary | ICD-10-CM | POA: Insufficient documentation

## 2019-03-15 DIAGNOSIS — I444 Left anterior fascicular block: Secondary | ICD-10-CM | POA: Diagnosis not present

## 2019-03-15 DIAGNOSIS — I10 Essential (primary) hypertension: Secondary | ICD-10-CM | POA: Diagnosis not present

## 2019-03-15 DIAGNOSIS — Z20822 Contact with and (suspected) exposure to covid-19: Secondary | ICD-10-CM | POA: Diagnosis not present

## 2019-03-15 LAB — CBC
HCT: 47.9 % (ref 39.0–52.0)
Hemoglobin: 15.2 g/dL (ref 13.0–17.0)
MCH: 29.7 pg (ref 26.0–34.0)
MCHC: 31.7 g/dL (ref 30.0–36.0)
MCV: 93.7 fL (ref 80.0–100.0)
Platelets: 336 10*3/uL (ref 150–400)
RBC: 5.11 MIL/uL (ref 4.22–5.81)
RDW: 12.7 % (ref 11.5–15.5)
WBC: 7.1 10*3/uL (ref 4.0–10.5)
nRBC: 0 % (ref 0.0–0.2)

## 2019-03-15 LAB — BASIC METABOLIC PANEL
Anion gap: 11 (ref 5–15)
BUN: 16 mg/dL (ref 8–23)
CO2: 28 mmol/L (ref 22–32)
Calcium: 9.5 mg/dL (ref 8.9–10.3)
Chloride: 100 mmol/L (ref 98–111)
Creatinine, Ser: 0.86 mg/dL (ref 0.61–1.24)
GFR calc Af Amer: >60 mL/min (ref >60)
GFR calc non Af Amer: >60 mL/min (ref >60)
Glucose, Bld: 142 mg/dL — ABNORMAL HIGH (ref 70–99)
Potassium: 4.1 mmol/L (ref 3.5–5.1)
Sodium: 139 mmol/L (ref 135–145)

## 2019-03-15 LAB — SARS CORONAVIRUS 2 (TAT 6-24 HRS): SARS Coronavirus 2: NEGATIVE

## 2019-03-16 ENCOUNTER — Encounter: Payer: Self-pay | Admitting: Anesthesiology

## 2019-03-16 DIAGNOSIS — E118 Type 2 diabetes mellitus with unspecified complications: Secondary | ICD-10-CM | POA: Diagnosis not present

## 2019-03-16 DIAGNOSIS — I2510 Atherosclerotic heart disease of native coronary artery without angina pectoris: Secondary | ICD-10-CM | POA: Diagnosis not present

## 2019-03-16 DIAGNOSIS — I10 Essential (primary) hypertension: Secondary | ICD-10-CM | POA: Diagnosis not present

## 2019-03-16 NOTE — Pre-Procedure Instructions (Signed)
Tiffany at [facility]'s office notified of abn EKG and need for clearance. All necessary ppw faxed. Dr Joice Lofts also aware.

## 2019-03-17 ENCOUNTER — Encounter: Admission: RE | Payer: Self-pay | Source: Home / Self Care

## 2019-03-17 ENCOUNTER — Ambulatory Visit: Admission: RE | Admit: 2019-03-17 | Payer: Medicare HMO | Source: Home / Self Care | Admitting: Surgery

## 2019-03-17 HISTORY — DX: Unspecified osteoarthritis, unspecified site: M19.90

## 2019-03-17 SURGERY — SHOULDER ARTHROSCOPY WITH ROTATOR CUFF REPAIR AND OPEN BICEPS TENODESIS
Anesthesia: Choice | Site: Shoulder | Laterality: Right

## 2019-03-22 DIAGNOSIS — Z01810 Encounter for preprocedural cardiovascular examination: Secondary | ICD-10-CM | POA: Diagnosis not present

## 2019-03-22 DIAGNOSIS — I10 Essential (primary) hypertension: Secondary | ICD-10-CM | POA: Diagnosis not present

## 2019-03-22 DIAGNOSIS — E118 Type 2 diabetes mellitus with unspecified complications: Secondary | ICD-10-CM | POA: Diagnosis not present

## 2019-03-22 DIAGNOSIS — I2510 Atherosclerotic heart disease of native coronary artery without angina pectoris: Secondary | ICD-10-CM | POA: Diagnosis not present

## 2019-03-22 DIAGNOSIS — Z794 Long term (current) use of insulin: Secondary | ICD-10-CM | POA: Diagnosis not present

## 2019-03-22 DIAGNOSIS — J449 Chronic obstructive pulmonary disease, unspecified: Secondary | ICD-10-CM | POA: Diagnosis not present

## 2019-03-25 DIAGNOSIS — E118 Type 2 diabetes mellitus with unspecified complications: Secondary | ICD-10-CM | POA: Diagnosis not present

## 2019-03-25 DIAGNOSIS — Z794 Long term (current) use of insulin: Secondary | ICD-10-CM | POA: Diagnosis not present

## 2019-03-25 DIAGNOSIS — I10 Essential (primary) hypertension: Secondary | ICD-10-CM | POA: Diagnosis not present

## 2019-03-25 DIAGNOSIS — I2510 Atherosclerotic heart disease of native coronary artery without angina pectoris: Secondary | ICD-10-CM | POA: Diagnosis not present

## 2019-03-25 DIAGNOSIS — Z87891 Personal history of nicotine dependence: Secondary | ICD-10-CM | POA: Diagnosis not present

## 2019-03-25 DIAGNOSIS — J449 Chronic obstructive pulmonary disease, unspecified: Secondary | ICD-10-CM | POA: Diagnosis not present

## 2019-03-29 DIAGNOSIS — I2510 Atherosclerotic heart disease of native coronary artery without angina pectoris: Secondary | ICD-10-CM | POA: Diagnosis not present

## 2019-03-29 DIAGNOSIS — Z01810 Encounter for preprocedural cardiovascular examination: Secondary | ICD-10-CM | POA: Diagnosis not present

## 2019-04-01 DIAGNOSIS — Z01810 Encounter for preprocedural cardiovascular examination: Secondary | ICD-10-CM | POA: Diagnosis not present

## 2019-04-01 DIAGNOSIS — J449 Chronic obstructive pulmonary disease, unspecified: Secondary | ICD-10-CM | POA: Diagnosis not present

## 2019-04-01 DIAGNOSIS — E118 Type 2 diabetes mellitus with unspecified complications: Secondary | ICD-10-CM | POA: Diagnosis not present

## 2019-04-01 DIAGNOSIS — I10 Essential (primary) hypertension: Secondary | ICD-10-CM | POA: Diagnosis not present

## 2019-04-01 DIAGNOSIS — I2510 Atherosclerotic heart disease of native coronary artery without angina pectoris: Secondary | ICD-10-CM | POA: Diagnosis not present

## 2019-04-01 DIAGNOSIS — R55 Syncope and collapse: Secondary | ICD-10-CM | POA: Diagnosis not present

## 2019-04-01 DIAGNOSIS — Z794 Long term (current) use of insulin: Secondary | ICD-10-CM | POA: Diagnosis not present

## 2019-04-07 ENCOUNTER — Other Ambulatory Visit: Payer: Self-pay | Admitting: Surgery

## 2019-04-07 NOTE — Pre-Procedure Instructions (Addendum)
Secure chat sent to Dr Joice Lofts for new orders. Case originally cancelled. Original orders cancelled with the case.  Received respose from Dr Joice Lofts, he is aware.

## 2019-04-13 DIAGNOSIS — M75111 Incomplete rotator cuff tear or rupture of right shoulder, not specified as traumatic: Secondary | ICD-10-CM | POA: Diagnosis not present

## 2019-04-14 ENCOUNTER — Encounter: Admission: RE | Admit: 2019-04-14 | Payer: Medicare HMO | Source: Ambulatory Visit

## 2019-04-15 ENCOUNTER — Other Ambulatory Visit: Payer: Self-pay

## 2019-04-15 ENCOUNTER — Other Ambulatory Visit
Admission: RE | Admit: 2019-04-15 | Discharge: 2019-04-15 | Disposition: A | Discharge status: Home / Self Care | Payer: Medicare HMO | Source: Ambulatory Visit | Attending: Surgery | Admitting: Surgery

## 2019-04-15 DIAGNOSIS — Z01812 Encounter for preprocedural laboratory examination: Secondary | ICD-10-CM | POA: Diagnosis not present

## 2019-04-15 DIAGNOSIS — Z20822 Contact with and (suspected) exposure to covid-19: Secondary | ICD-10-CM | POA: Diagnosis not present

## 2019-04-15 LAB — SARS CORONAVIRUS 2 (TAT 6-24 HRS): SARS Coronavirus 2: NEGATIVE

## 2019-04-18 MED ORDER — CEFAZOLIN SODIUM-DEXTROSE 2-4 GM/100ML-% IV SOLN
2.0000 g | INTRAVENOUS | Status: AC
  Administered 2019-04-19: 2 g via INTRAVENOUS

## 2019-04-19 ENCOUNTER — Ambulatory Visit
Admission: RE | Admit: 2019-04-19 | Discharge: 2019-04-19 | Disposition: A | Discharge status: Home / Self Care | Payer: Medicare HMO | Attending: Surgery | Admitting: Surgery

## 2019-04-19 ENCOUNTER — Encounter
Admission: RE | Disposition: A | Discharge status: Home / Self Care | Payer: Self-pay | Source: Home / Self Care | Attending: Surgery

## 2019-04-19 ENCOUNTER — Ambulatory Visit: Payer: Medicare HMO

## 2019-04-19 ENCOUNTER — Encounter: Payer: Self-pay | Admitting: Surgery

## 2019-04-19 ENCOUNTER — Other Ambulatory Visit: Payer: Self-pay

## 2019-04-19 DIAGNOSIS — Z955 Presence of coronary angioplasty implant and graft: Secondary | ICD-10-CM | POA: Insufficient documentation

## 2019-04-19 DIAGNOSIS — Z79899 Other long term (current) drug therapy: Secondary | ICD-10-CM | POA: Insufficient documentation

## 2019-04-19 DIAGNOSIS — M7521 Bicipital tendinitis, right shoulder: Secondary | ICD-10-CM | POA: Diagnosis not present

## 2019-04-19 DIAGNOSIS — Z794 Long term (current) use of insulin: Secondary | ICD-10-CM | POA: Insufficient documentation

## 2019-04-19 DIAGNOSIS — G8918 Other acute postprocedural pain: Secondary | ICD-10-CM | POA: Diagnosis not present

## 2019-04-19 DIAGNOSIS — E1140 Type 2 diabetes mellitus with diabetic neuropathy, unspecified: Secondary | ICD-10-CM | POA: Diagnosis not present

## 2019-04-19 DIAGNOSIS — M75101 Unspecified rotator cuff tear or rupture of right shoulder, not specified as traumatic: Secondary | ICD-10-CM

## 2019-04-19 DIAGNOSIS — M7591 Shoulder lesion, unspecified, right shoulder: Secondary | ICD-10-CM | POA: Insufficient documentation

## 2019-04-19 DIAGNOSIS — I10 Essential (primary) hypertension: Secondary | ICD-10-CM | POA: Diagnosis not present

## 2019-04-19 DIAGNOSIS — Z87891 Personal history of nicotine dependence: Secondary | ICD-10-CM | POA: Insufficient documentation

## 2019-04-19 DIAGNOSIS — E1142 Type 2 diabetes mellitus with diabetic polyneuropathy: Secondary | ICD-10-CM | POA: Diagnosis not present

## 2019-04-19 DIAGNOSIS — M25511 Pain in right shoulder: Secondary | ICD-10-CM | POA: Diagnosis not present

## 2019-04-19 DIAGNOSIS — M25811 Other specified joint disorders, right shoulder: Secondary | ICD-10-CM | POA: Insufficient documentation

## 2019-04-19 DIAGNOSIS — E785 Hyperlipidemia, unspecified: Secondary | ICD-10-CM | POA: Insufficient documentation

## 2019-04-19 DIAGNOSIS — M7541 Impingement syndrome of right shoulder: Secondary | ICD-10-CM | POA: Diagnosis not present

## 2019-04-19 DIAGNOSIS — I252 Old myocardial infarction: Secondary | ICD-10-CM | POA: Insufficient documentation

## 2019-04-19 DIAGNOSIS — M7581 Other shoulder lesions, right shoulder: Secondary | ICD-10-CM | POA: Diagnosis not present

## 2019-04-19 DIAGNOSIS — M75111 Incomplete rotator cuff tear or rupture of right shoulder, not specified as traumatic: Secondary | ICD-10-CM | POA: Diagnosis not present

## 2019-04-19 DIAGNOSIS — J449 Chronic obstructive pulmonary disease, unspecified: Secondary | ICD-10-CM | POA: Insufficient documentation

## 2019-04-19 DIAGNOSIS — Z7902 Long term (current) use of antithrombotics/antiplatelets: Secondary | ICD-10-CM | POA: Insufficient documentation

## 2019-04-19 DIAGNOSIS — Z419 Encounter for procedure for purposes other than remedying health state, unspecified: Secondary | ICD-10-CM

## 2019-04-19 HISTORY — PX: SHOULDER ARTHROSCOPY WITH ROTATOR CUFF REPAIR AND OPEN BICEPS TENODESIS: SHX6677

## 2019-04-19 LAB — GLUCOSE, CAPILLARY
Glucose-Capillary: 109 mg/dL — ABNORMAL HIGH (ref 70–99)
Glucose-Capillary: 139 mg/dL — ABNORMAL HIGH (ref 70–99)

## 2019-04-19 SURGERY — SHOULDER ARTHROSCOPY WITH ROTATOR CUFF REPAIR AND OPEN BICEPS TENODESIS
Anesthesia: General | Site: Shoulder | Laterality: Right

## 2019-04-19 MED ORDER — FENTANYL CITRATE (PF) 100 MCG/2ML IJ SOLN
INTRAMUSCULAR | Status: AC
  Filled 2019-04-19: qty 2

## 2019-04-19 MED ORDER — BUPIVACAINE LIPOSOME 1.3 % IJ SUSP
INTRAMUSCULAR | Status: DC | PRN
  Administered 2019-04-19 (×2): 10 mL

## 2019-04-19 MED ORDER — PHENYLEPHRINE HCL (PRESSORS) 10 MG/ML IV SOLN
INTRAVENOUS | Status: DC | PRN
  Administered 2019-04-19: 300 ug via INTRAVENOUS
  Administered 2019-04-19 (×2): 200 ug via INTRAVENOUS

## 2019-04-19 MED ORDER — BUPIVACAINE-EPINEPHRINE 0.5% -1:200000 IJ SOLN
INTRAMUSCULAR | Status: DC | PRN
  Administered 2019-04-19: 30 mL

## 2019-04-19 MED ORDER — ACETAMINOPHEN 10 MG/ML IV SOLN
INTRAVENOUS | Status: DC | PRN
  Administered 2019-04-19: 1000 mg via INTRAVENOUS

## 2019-04-19 MED ORDER — FENTANYL CITRATE (PF) 100 MCG/2ML IJ SOLN
INTRAMUSCULAR | Status: AC
  Administered 2019-04-19: 13:00:00 50 ug via INTRAVENOUS
  Filled 2019-04-19: qty 2

## 2019-04-19 MED ORDER — METOCLOPRAMIDE HCL 10 MG PO TABS: 5.0000 mg | ORAL_TABLET | Freq: Three times a day (TID) | ORAL | Status: DC | PRN

## 2019-04-19 MED ORDER — DEXAMETHASONE SODIUM PHOSPHATE 10 MG/ML IJ SOLN
INTRAMUSCULAR | Status: DC | PRN
  Administered 2019-04-19: 10 mg via INTRAVENOUS

## 2019-04-19 MED ORDER — OXYCODONE HCL 5 MG/5ML PO SOLN: 5.0000 mg | Freq: Once | ORAL | Status: DC | PRN

## 2019-04-19 MED ORDER — FAMOTIDINE 20 MG PO TABS: 20.0000 mg | ORAL_TABLET | Freq: Once | ORAL | Status: AC

## 2019-04-19 MED ORDER — ONDANSETRON HCL 4 MG/2ML IJ SOLN
INTRAMUSCULAR | Status: DC | PRN
  Administered 2019-04-19: 4 mg via INTRAVENOUS

## 2019-04-19 MED ORDER — MIDAZOLAM HCL 2 MG/2ML IJ SOLN
INTRAMUSCULAR | Status: AC
  Filled 2019-04-19: qty 2

## 2019-04-19 MED ORDER — VASOPRESSIN 20 UNIT/ML IV SOLN
INTRAVENOUS | Status: AC
  Filled 2019-04-19: qty 1

## 2019-04-19 MED ORDER — PROPOFOL 10 MG/ML IV BOLUS
INTRAVENOUS | Status: DC | PRN
  Administered 2019-04-19: 150 mg via INTRAVENOUS
  Administered 2019-04-19: 30 mg via INTRAVENOUS

## 2019-04-19 MED ORDER — FENTANYL CITRATE (PF) 100 MCG/2ML IJ SOLN: 25.0000 ug | INTRAMUSCULAR | Status: DC | PRN

## 2019-04-19 MED ORDER — OXYCODONE HCL 5 MG PO TABS: 5.0000 mg | ORAL_TABLET | Freq: Once | ORAL | Status: DC | PRN

## 2019-04-19 MED ORDER — SODIUM CHLORIDE 0.9 % IV SOLN
INTRAVENOUS | Status: DC
  Administered 2019-04-19: 500 mL/h via INTRAVENOUS

## 2019-04-19 MED ORDER — LIDOCAINE HCL (CARDIAC) PF 100 MG/5ML IV SOSY
PREFILLED_SYRINGE | INTRAVENOUS | Status: DC | PRN
  Administered 2019-04-19: 80 mg via INTRAVENOUS

## 2019-04-19 MED ORDER — FENTANYL CITRATE (PF) 100 MCG/2ML IJ SOLN
INTRAMUSCULAR | Status: DC | PRN
  Administered 2019-04-19 (×2): 50 ug via INTRAVENOUS

## 2019-04-19 MED ORDER — ONDANSETRON HCL 4 MG/2ML IJ SOLN: 4.0000 mg | Freq: Four times a day (QID) | INTRAMUSCULAR | Status: DC | PRN

## 2019-04-19 MED ORDER — FENTANYL CITRATE (PF) 100 MCG/2ML IJ SOLN: 50.0000 ug | Freq: Once | INTRAMUSCULAR | Status: AC

## 2019-04-19 MED ORDER — SUGAMMADEX SODIUM 200 MG/2ML IV SOLN
INTRAVENOUS | Status: AC
  Filled 2019-04-19: qty 2

## 2019-04-19 MED ORDER — ROCURONIUM BROMIDE 50 MG/5ML IV SOLN
INTRAVENOUS | Status: AC
  Filled 2019-04-19: qty 1

## 2019-04-19 MED ORDER — CEFAZOLIN SODIUM-DEXTROSE 2-4 GM/100ML-% IV SOLN
INTRAVENOUS | Status: AC
  Filled 2019-04-19: qty 100

## 2019-04-19 MED ORDER — PHENYLEPHRINE HCL (PRESSORS) 10 MG/ML IV SOLN
INTRAVENOUS | Status: AC
  Filled 2019-04-19: qty 1

## 2019-04-19 MED ORDER — ACETAMINOPHEN 10 MG/ML IV SOLN
INTRAVENOUS | Status: AC
  Filled 2019-04-19: qty 100

## 2019-04-19 MED ORDER — POTASSIUM CHLORIDE IN NACL 20-0.9 MEQ/L-% IV SOLN
INTRAVENOUS | Status: DC
  Filled 2019-04-19: qty 1000

## 2019-04-19 MED ORDER — ESMOLOL HCL 100 MG/10ML IV SOLN
INTRAVENOUS | Status: DC | PRN
  Administered 2019-04-19 (×2): 10 mg via INTRAVENOUS

## 2019-04-19 MED ORDER — DEXAMETHASONE SODIUM PHOSPHATE 10 MG/ML IJ SOLN
INTRAMUSCULAR | Status: AC
  Filled 2019-04-19: qty 1

## 2019-04-19 MED ORDER — LIDOCAINE HCL (PF) 1 % IJ SOLN
INTRAMUSCULAR | Status: AC
  Filled 2019-04-19: qty 5

## 2019-04-19 MED ORDER — LABETALOL HCL 5 MG/ML IV SOLN
INTRAVENOUS | Status: DC | PRN
  Administered 2019-04-19: 10 mg via INTRAVENOUS

## 2019-04-19 MED ORDER — BUPIVACAINE LIPOSOME 1.3 % IJ SUSP
INTRAMUSCULAR | Status: AC
  Filled 2019-04-19: qty 20

## 2019-04-19 MED ORDER — METOCLOPRAMIDE HCL 5 MG/ML IJ SOLN: 5.0000 mg | Freq: Three times a day (TID) | INTRAMUSCULAR | Status: DC | PRN

## 2019-04-19 MED ORDER — MIDAZOLAM HCL 2 MG/2ML IJ SOLN: 1.0000 mg | Freq: Once | INTRAMUSCULAR | Status: DC

## 2019-04-19 MED ORDER — CHLORHEXIDINE GLUCONATE 4 % EX LIQD: 60.0000 mL | Freq: Once | CUTANEOUS | Status: DC

## 2019-04-19 MED ORDER — ROCURONIUM BROMIDE 100 MG/10ML IV SOLN
INTRAVENOUS | Status: DC | PRN
  Administered 2019-04-19: 50 mg via INTRAVENOUS

## 2019-04-19 MED ORDER — SUCCINYLCHOLINE CHLORIDE 20 MG/ML IJ SOLN
INTRAMUSCULAR | Status: AC
  Filled 2019-04-19: qty 1

## 2019-04-19 MED ORDER — SODIUM CHLORIDE (PF) 0.9 % IJ SOLN
500.0000 mL | Freq: Once | INTRAMUSCULAR | Status: DC
  Filled 2019-04-19: qty 500

## 2019-04-19 MED ORDER — BUPIVACAINE HCL (PF) 0.5 % IJ SOLN
INTRAMUSCULAR | Status: DC | PRN
  Administered 2019-04-19 (×2): 5 mL

## 2019-04-19 MED ORDER — LABETALOL HCL 5 MG/ML IV SOLN
INTRAVENOUS | Status: AC
  Filled 2019-04-19: qty 4

## 2019-04-19 MED ORDER — SODIUM CHLORIDE 0.9 % IV SOLN
INTRAVENOUS | Status: DC | PRN
  Administered 2019-04-19: 50 ug/min via INTRAVENOUS

## 2019-04-19 MED ORDER — SUGAMMADEX SODIUM 200 MG/2ML IV SOLN
INTRAVENOUS | Status: DC | PRN
  Administered 2019-04-19: 400 mg via INTRAVENOUS

## 2019-04-19 MED ORDER — ONDANSETRON HCL 4 MG PO TABS: 4.0000 mg | ORAL_TABLET | Freq: Four times a day (QID) | ORAL | Status: DC | PRN

## 2019-04-19 MED ORDER — ESMOLOL HCL 100 MG/10ML IV SOLN
INTRAVENOUS | Status: AC
  Filled 2019-04-19: qty 10

## 2019-04-19 MED ORDER — VASOPRESSIN 20 UNIT/ML IV SOLN
INTRAVENOUS | Status: DC | PRN
  Administered 2019-04-19: 1 [IU] via INTRAVENOUS

## 2019-04-19 MED ORDER — LIDOCAINE HCL (PF) 1 % IJ SOLN
INTRAMUSCULAR | Status: DC | PRN
  Administered 2019-04-19: 2 mL

## 2019-04-19 MED ORDER — SUCCINYLCHOLINE CHLORIDE 20 MG/ML IJ SOLN
INTRAMUSCULAR | Status: DC | PRN
  Administered 2019-04-19: 120 mg via INTRAVENOUS

## 2019-04-19 MED ORDER — HYDROCODONE-ACETAMINOPHEN 5-325 MG PO TABS: 1.0000 | ORAL_TABLET | Freq: Four times a day (QID) | ORAL | 0 refills | Status: DC | PRN

## 2019-04-19 MED ORDER — LACTATED RINGERS IV SOLN
INTRAVENOUS | Status: DC | PRN
  Administered 2019-04-19: 15:00:00 1 mL

## 2019-04-19 MED ORDER — FAMOTIDINE 20 MG PO TABS
ORAL_TABLET | ORAL | Status: AC
  Administered 2019-04-19: 20 mg via ORAL
  Filled 2019-04-19: qty 1

## 2019-04-19 MED ORDER — BUPIVACAINE HCL (PF) 0.5 % IJ SOLN
INTRAMUSCULAR | Status: AC
  Filled 2019-04-19: qty 10

## 2019-04-19 MED ORDER — ONDANSETRON HCL 4 MG/2ML IJ SOLN
INTRAMUSCULAR | Status: AC
  Filled 2019-04-19: qty 2

## 2019-04-19 MED ORDER — HYDROCODONE-ACETAMINOPHEN 5-325 MG PO TABS: 1.0000 | ORAL_TABLET | ORAL | Status: DC | PRN

## 2019-04-19 SURGICAL SUPPLY — 49 items
ANCHOR JUGGERKNOT WTAP NDL 2.9 (Anchor) ×2 IMPLANT
BIT DRILL JUGRKNT W/NDL BIT2.9 (DRILL) IMPLANT
BLADE FULL RADIUS 3.5 (BLADE) ×3 IMPLANT
BUR ACROMIONIZER 4.0 (BURR) ×3 IMPLANT
CANNULA SHAVER 8MMX76MM (CANNULA) ×3 IMPLANT
CHLORAPREP W/TINT 26 (MISCELLANEOUS) ×5 IMPLANT
COVER MAYO STAND REUSABLE (DRAPES) ×3 IMPLANT
COVER WAND RF STERILE (DRAPES) ×3 IMPLANT
DRAPE IMP U-DRAPE 54X76 (DRAPES) ×6 IMPLANT
DRILL JUGGERKNOT W/NDL BIT 2.9 (DRILL) ×3
ELECT CAUTERY BLADE 6.4 (BLADE) ×2 IMPLANT
ELECT CAUTERY BLADE TIP 2.5 (TIP) ×3
ELECT REM PT RETURN 9FT ADLT (ELECTROSURGICAL) ×3
ELECTRODE CAUTERY BLDE TIP 2.5 (TIP) ×1 IMPLANT
ELECTRODE REM PT RTRN 9FT ADLT (ELECTROSURGICAL) ×1 IMPLANT
GAUZE SPONGE 4X4 12PLY STRL (GAUZE/BANDAGES/DRESSINGS) ×3 IMPLANT
GAUZE XEROFORM 1X8 LF (GAUZE/BANDAGES/DRESSINGS) ×3 IMPLANT
GLOVE BIO SURGEON STRL SZ7.5 (GLOVE) ×6 IMPLANT
GLOVE BIO SURGEON STRL SZ8 (GLOVE) ×6 IMPLANT
GLOVE BIOGEL PI IND STRL 8 (GLOVE) ×1 IMPLANT
GLOVE BIOGEL PI INDICATOR 8 (GLOVE) ×2
GLOVE INDICATOR 8.0 STRL GRN (GLOVE) ×3 IMPLANT
GOWN STRL REUS W/ TWL LRG LVL3 (GOWN DISPOSABLE) ×1 IMPLANT
GOWN STRL REUS W/ TWL XL LVL3 (GOWN DISPOSABLE) ×1 IMPLANT
GOWN STRL REUS W/TWL LRG LVL3 (GOWN DISPOSABLE) ×2
GOWN STRL REUS W/TWL XL LVL3 (GOWN DISPOSABLE) ×2
GRASPER SUT 15 45D LOW PRO (SUTURE) IMPLANT
IV LACTATED RINGER IRRG 3000ML (IV SOLUTION) ×2
IV LR IRRIG 3000ML ARTHROMATIC (IV SOLUTION) ×2 IMPLANT
KIT CANNULA 8X76-LX IN CANNULA (CANNULA) IMPLANT
MANIFOLD NEPTUNE II (INSTRUMENTS) ×3 IMPLANT
MASK FACE SPIDER DISP (MASK) ×3 IMPLANT
MAT ABSORB  FLUID 56X50 GRAY (MISCELLANEOUS) ×2
MAT ABSORB FLUID 56X50 GRAY (MISCELLANEOUS) ×1 IMPLANT
PACK ARTHROSCOPY SHOULDER (MISCELLANEOUS) ×3 IMPLANT
PASSER SUT FIRSTPASS SELF (INSTRUMENTS) IMPLANT
SLING ARM LRG DEEP (SOFTGOODS) ×3 IMPLANT
SLING ULTRA II LG (MISCELLANEOUS) ×3 IMPLANT
STAPLER SKIN PROX 35W (STAPLE) ×3 IMPLANT
STRAP SAFETY 5IN WIDE (MISCELLANEOUS) ×3 IMPLANT
SUT ETHIBOND 0 MO6 C/R (SUTURE) ×3 IMPLANT
SUT ULTRABRAID 2 COBRAID 38 (SUTURE) IMPLANT
SUT VIC AB 2-0 CT1 27 (SUTURE) ×4
SUT VIC AB 2-0 CT1 TAPERPNT 27 (SUTURE) ×2 IMPLANT
TAPE MICROFOAM 4IN (TAPE) ×3 IMPLANT
TUBING ARTHRO INFLOW-ONLY STRL (TUBING) ×3 IMPLANT
TUBING CONNECTING 10 (TUBING) ×2 IMPLANT
TUBING CONNECTING 10' (TUBING) ×1
WAND WEREWOLF FLOW 90D (MISCELLANEOUS) ×3 IMPLANT

## 2019-04-19 NOTE — H&P (Signed)
Paper H&P to be scanned into permanent record. H&P reviewed and patient re-examined. No changes. 

## 2019-04-19 NOTE — Anesthesia Procedure Notes (Signed)
Procedure Name: Intubation Date/Time: 04/19/2019 1:41 PM Performed by: Doreen Salvage, CRNA Pre-anesthesia Checklist: Patient identified, Patient being monitored, Timeout performed, Emergency Drugs available and Suction available Patient Re-evaluated:Patient Re-evaluated prior to induction Oxygen Delivery Method: Circle system utilized Preoxygenation: Pre-oxygenation with 100% oxygen Induction Type: IV induction Ventilation: Mask ventilation without difficulty Laryngoscope Size: Mac, 4 and McGraph Grade View: Grade I Tube type: Oral Tube size: 7.5 mm Number of attempts: 1 Airway Equipment and Method: Stylet Placement Confirmation: ETT inserted through vocal cords under direct vision,  positive ETCO2 and breath sounds checked- equal and bilateral Secured at: 22 cm Tube secured with: Tape Dental Injury: Teeth and Oropharynx as per pre-operative assessment

## 2019-04-19 NOTE — Discharge Instructions (Addendum)
Interscalene Nerve Block, Care After This sheet gives you information about how to care for yourself after your procedure. Your health care provider may also give you more specific instructions. If you have problems or questions, contact your health care provider. What can I expect after the procedure? After the procedure, it is common to have:  Soreness or tenderness in your neck.  Numbness in your shoulder, upper arm, and some fingers.  Weakness in your shoulder and arm muscles. The feeling and strength in your shoulder, arm, and fingers should return to normal within hours after your procedure. Follow these instructions at home: For at least 24 hours after the procedure:  Do not: ? Participate in activities in which you could fall or become injured. ? Drive. ? Use heavy machinery. ? Drink alcohol. ? Take sleeping pills or medicines that cause drowsiness. ? Make important decisions or sign legal documents. ? Take care of children on your own.  Rest. Eating and drinking  If you vomit, drink water, juice, or soup when you can drink without vomiting.  Make sure you have little or no nausea before eating solid foods.  Follow the diet that is recommended by your health care provider. If you have a sling:  Wear it as told by your health care provider. Remove it only as told by your health care provider.  Loosen the sling if your fingers tingle, become numb, or turn cold and blue.  Make sure that your entire arm, including your wrist, is supported. Do not allow your wrist to dangle over the end of the sling.  Do not let your sling get wet if it is not waterproof.  Keep the sling clean. Bathing  Do not take baths, swim, or use a hot tub until your health care provider approves.  If you have a nerve block catheter in place, keep the incision site and tubing dry. Injection site care   Wash your hands with soap and water before you change your bandage (dressing). If soap and  water are not available, use hand sanitizer.  Change your dressing as told by your health care provider.  Keep your dressing dry.  Check your nerve block injection site every day for signs of infection. Check for: ? Redness, swelling, or pain. ? Fluid or blood. ? Warmth. Activity  Do not perform complex or risky activities while taking prescription pain medicine and until you have fully recovered.  Return to your normal activities as told by your health care provider and as you can tolerate them. Ask your health care provider what activities are safe for you.  Rest and take it easy. This will help you heal and recover more quickly and fully.  Be very cautious until you have regained strength and sensation. General instructions  Have a responsible adult stay with you until you are awake and alert.  Do not drive or use heavy machinery while taking prescription pain medicine and until you have fully recovered. Ask your health care provider when it is safe to drive.  Take over-the-counter and prescription medicines only as told by your health care provider.  If you smoke, do not smoke without supervision.  Do not expose your arm or shoulder to very cold or very hot temperatures until you have full feeling back.  If you have a nerve block catheter in place: ? Try to keep the catheter from getting kinked or pinched. ? Avoid pulling or tugging on the catheter.  Keep all follow-up visits as told  by your health care provider. This is important. Contact a health care provider if:  You have chills or fever.  You have redness, swelling, or pain around your injection site.  You have fluid or blood coming from the injection site.  The skin around the injection site is warm to the touch.  There is a bad smell coming from your dressing.  You have hoarseness or a drooping or dry eye that lasts more than a few days.  You have pain that is poorly controlled with the block or with pain  medicine.  You have numbness, tingling, or weakness in your shoulder or arm that lasts for more than one week. Get help right away if:  You have severe pain.  You lose or do not regain strength and sensation in your arm even after the nerve block medicine has stopped.  You have trouble breathing.  You have a nerve block catheter still in place and you begin to shiver.  You have a nerve block catheter still in place and you are getting more and more numb or weak. This information is not intended to replace advice given to you by your health care provider. Make sure you discuss any questions you have with your health care provider. Document Revised: 02/13/2017 Document Reviewed: 10/12/2015 Elsevier Patient Education  2020 [employer]       AMBULATORY SURGERY  DISCHARGE INSTRUCTIONS   1) The drugs that you were given will stay in your system until tomorrow so for the next 24 hours you should not:  A) Drive an automobile B) Make any legal decisions C) Drink any alcoholic beverage   2) You may resume regular meals tomorrow.  Today it is better to start with liquids and gradually work up to solid foods.  You may eat anything you prefer, but it is better to start with liquids, then soup and crackers, and gradually work up to solid foods.   3) Please notify your doctor immediately if you have any unusual bleeding, trouble breathing, redness and pain at the surgery site, drainage, fever, or pain not relieved by medication.    4) Additional Instructions:        Please contact your physician with any problems or Same Day Surgery at [phone], Monday through Friday 6 am to 4 pm, or [place] [facility] number at [phone].Orthopedic discharge instructions: Keep dressing dry and intact.  May shower after dressing changed on post-op day #4 (Saturday).  Cover staples with Band-Aids after drying off. Apply ice frequently to shoulder. May resume Plavix tomorrow  morning. Take hydrocodone as prescribed when needed.  May supplement with ES Tylenol if necessary. Keep shoulder immobilizer on at all times except may remove for bathing purposes. Follow-up in 10-14 days or as scheduled.

## 2019-04-19 NOTE — Op Note (Signed)
04/19/2019  2:55 PM  Patient:   Gregory Mendoza  Pre-Op Diagnosis:   Impingement/tendinopathy with possible partial-thickness rotator cuff tear and biceps tendinopathy, right shoulder.  Post-Op Diagnosis:   Impingement/tendinopathy with degenerative labral fraying and biceps tendinopathy, right shoulder.  Procedure:   Limited arthroscopic debridement, arthroscopic subacromial decompression, and mini-open biceps tenodesis, right shoulder.  Anesthesia:   General endotracheal with interscalene block using Exparel placed preoperatively by the anesthesiologist.  Surgeon:   Maryagnes Amos, MD  Assistant:   Horris Latino, PA-C  Findings:   As above.  There was mild labral fraying anteriorly and superiorly without frank detachment from the glenoid.  The rotator cuff appeared to be in satisfactory condition with only minor degenerative fraying involving the superior insertional fibers of the subscapularis tendon and the more posterior insertional fibers of the supraspinatus tendon.  The biceps tendon demonstrated moderate tendinopathy with early fraying.  The articular surfaces of both the glenoid and humerus were in excellent condition.  Complications:   None  Fluids:   500 cc  Estimated blood loss:   5 cc  Tourniquet time:   None  Drains:   None  Closure:   Staples      Brief clinical note:   The patient is a 74 year old male. The patient's symptoms have progressed despite medications, activity modification, etc. The patient's history and examination are consistent with impingement/tendinopathy with a possible rotator cuff tear. The patient's preoperative MRI scan suggested the presence of a possible partial-thickness rotator cuff tear, as well as some biceps tendinopathy. The patient presents at this time for definitive management of these shoulder symptoms.  Procedure:   The patient underwent placement of an interscalene block using Exparel by the anesthesiologist in the preoperative  holding area before being brought into the operating room and lain in the supine position. The patient then underwent general endotracheal intubation and anesthesia before being repositioned in the beach chair position using the beach chair positioner. The right shoulder and upper extremity were prepped with ChloraPrep solution before being draped sterilely. Preoperative antibiotics were administered. A timeout was performed to confirm the proper surgical site before the expected portal sites and incision site were injected with 0.5% Sensorcaine with epinephrine. A posterior portal was created and the glenohumeral joint thoroughly inspected with the findings as described above. An anterior portal was created using an outside-in technique. The labrum and rotator cuff were further probed, again confirming the above-noted findings. The areas of labral fraying were debrided back to stable margins using the full-radius resector. The ArthroCare wand was inserted and used to release the biceps tendon from its labral anchor. It also was used to obtain hemostasis as well as to "anneal" the labrum superiorly and anteriorly. The instruments were removed from the joint after suctioning the excess fluid.  The camera was repositioned through the posterior portal into the subacromial space. A separate lateral portal was created using an outside-in technique. The 3.5 mm full-radius resector was introduced and used to perform a subtotal bursectomy. The ArthroCare wand was then inserted and used to remove the periosteal tissue off the undersurface of the anterior third of the acromion as well as to recess the coracoacromial ligament from its attachment along the anterior and lateral margins of the acromion. The 4.0 mm acromionizing bur was introduced and used to complete the decompression by removing the undersurface of the anterior third of the acromion. The full radius resector was reintroduced to remove any residual bony debris  before the ArthroCare wand  was reintroduced to obtain hemostasis. The instruments were then removed from the subacromial space after suctioning the excess fluid.  An approximately 4-5 cm incision was made over the anterolateral aspect of the shoulder beginning at the anterolateral corner of the acromion and extending distally in line with the bicipital groove. This incision was carried down through the subcutaneous tissues to expose the deltoid fascia. The raphae between the anterior and middle thirds was identified and this plane developed to provide access into the subacromial space. Additional bursal tissues were debrided sharply using Metzenbaum scissors. The rotator cuff was carefully inspected and palpated.  There was no evidence for any partial or full-thickness tears of the rotator cuff.  The bicipital groove was identified by palpation and opened for 1-1.5 cm. The biceps tendon stump was retrieved through this defect. The floor of the bicipital groove was roughened with a curet before a single Biomet 2.9 mm JuggerKnot anchor was inserted. Both sets of sutures were passed through the biceps tendon and tied securely to effect the tenodesis. The bicipital sheath was reapproximated using two #0 Ethibond interrupted sutures, incorporating the biceps tendon to further reinforce the tenodesis.  The wound was copiously irrigated with sterile saline solution before the deltoid raphae was reapproximated using 2-0 Vicryl interrupted sutures. The subcutaneous tissues were closed in two layers using 2-0 Vicryl interrupted sutures before the skin was closed using staples. The portal sites also were closed using staples. A sterile bulky dressing was applied to the shoulder before the arm was placed into a shoulder immobilizer. The patient was then awakened, extubated, and returned to the recovery room in satisfactory condition after tolerating the procedure well.

## 2019-04-19 NOTE — Anesthesia Procedure Notes (Signed)
Anesthesia Regional Block: Interscalene brachial plexus block   Pre-Anesthetic Checklist: ,, timeout performed, Correct Patient, Correct Site, Correct Laterality, Correct Procedure, Correct Position, site marked, Risks and benefits discussed,  Surgical consent,  Pre-op evaluation,  At surgeon's request and post-op pain management  Laterality: Right  Prep: chloraprep       Needles:  Injection technique: Single-shot  Needle Type: Stimiplex     Needle Length: 9cm  Needle Gauge: 21     Additional Needles:   Procedures:,,,, ultrasound used (permanent image in chart),,,,  Narrative:  Start time: 04/19/2019 12:34 PM End time: 04/19/2019 12:38 PM  Performed by: Personally  Anesthesiologist: Karleen Hampshire, MD  Additional Notes: Risks and benefits of nerve block discussed with patient, including but not limited to risk of nerve injury, bleeding, infection, and failed block.  Patient expressed understanding and consented to block placement.   Functioning IV was confirmed and monitors were applied.  Sterile prep,hand hygiene and sterile gloves were used.  Minimal sedation used for procedure.  During the procedure, there was negative aspiration, negative paresthesia on injection, and dose was given in divided aliquots under ultrasound guidance.  Patient tolerated the procedure well with no immediate complications.

## 2019-04-19 NOTE — Transfer of Care (Signed)
Immediate Anesthesia Transfer of Care Note  Patient: Gregory Mendoza  Procedure(s) Performed: RIGHT SHOULDER ARTHROSCOPY WITH DEBRIDEMENT, DECOMPRESSION, ROTATOR CUFF REPAIR, AND POSSIBLE BICEPS TENODESIS. (Right Shoulder)  Patient Location: PACU  Anesthesia Type:General  Level of Consciousness: sedated  Airway & Oxygen Therapy: Patient Spontanous Breathing and Patient connected to face mask oxygen  Post-op Assessment: Report given to RN and Post -op Vital signs reviewed and stable  Post vital signs: Reviewed and stable  Last Vitals:  Vitals Value Taken Time  BP 168/92 04/19/19 1512  Temp    Pulse 116 04/19/19 1512  Resp 17 04/19/19 1512  SpO2 100 % 04/19/19 1512  Vitals shown include unvalidated device data.  Last Pain:  Vitals:   04/19/19 1512  TempSrc:   PainSc: 0-No pain         Complications: No apparent anesthesia complications

## 2019-04-19 NOTE — Anesthesia Preprocedure Evaluation (Addendum)
Anesthesia Evaluation  Patient identified by MRN, date of birth, ID band Patient awake    Reviewed: Allergy & Precautions, H&P , NPO status , Patient's Chart, lab work & pertinent test results  Airway Mallampati: III  TM Distance: <3 FB     Dental  (+) Chipped   Pulmonary COPD (denies any problem in 10 years since quitting smoking), neg recent URI, former smoker,    breath sounds clear to auscultation       Cardiovascular hypertension, (-) angina+ Past MI and + Cardiac Stents   Rhythm:regular Rate:Normal  NM myocardial perfusion study 03/30/19:  1. Negative ETT 2. Normal left ventricular function 3. Normal wall motion 4. No evidence for scar or ischemia   Neuro/Psych Peripheral neuropathy negative psych ROS   GI/Hepatic negative GI ROS, Neg liver ROS,   Endo/Other  diabetes, Insulin Dependent  Renal/GU      Musculoskeletal   Abdominal   Peds  Hematology negative hematology ROS (+)   Anesthesia Other Findings Past Medical History: No date: Arthritis No date: Chronic back pain No date: COPD (chronic obstructive pulmonary disease) (HCC) No date: Diabetes mellitus without complication (HCC) No date: Dyspnea No date: Dysrhythmia No date: Hyperlipidemia No date: Hypertension 2007, 2009: Myocardial infarction (HCC) No date: Peripheral neuropathy  Past Surgical History: 1970's: APPENDECTOMY 1997: BACK SURGERY     Comment:  Discectomy L4-L5 No date: CARDIAC CATHETERIZATION 2017: COLONOSCOPY 12/14/2017: CORONARY STENT INTERVENTION; N/A     Comment:  Procedure: CORONARY STENT INTERVENTION;  Surgeon:               Alwyn Pea, MD;  Location: [facility];               Service: Cardiovascular;  Laterality: N/A; 12/18/2017: CORONARY/GRAFT ACUTE MI REVASCULARIZATION; N/A     Comment:  Procedure: Coronary/Graft Acute MI Revascularization;                Surgeon: Alwyn Pea, MD;  Location:  [facility]              CV LAB;  Service: Cardiovascular;  Laterality: N/A; 02/08/2008: HERNIA REPAIR; Left     Comment:  Dr Lemar Livings 1999: INGUINAL HERNIA REPAIR; Right     Comment:  with mesh while in [place] 12/14/2017: LEFT HEART CATH AND CORONARY ANGIOGRAPHY; N/A     Comment:  Procedure: LEFT HEART CATH AND CORONARY ANGIOGRAPHY;                Surgeon: Lamar Blinks, MD;  Location: [facility]               CV LAB;  Service: Cardiovascular;  Laterality: N/A; 12/18/2017: LEFT HEART CATH AND CORONARY ANGIOGRAPHY; N/A     Comment:  Procedure: LEFT HEART CATH AND CORONARY ANGIOGRAPHY;                Surgeon: Alwyn Pea, MD;  Location: [facility]              CV LAB;  Service: Cardiovascular;  Laterality: N/A; No date: trigger thumb; Right     Reproductive/Obstetrics negative OB ROS                            Anesthesia Physical Anesthesia Plan  ASA: III  Anesthesia Plan: General ETT   Post-op Pain Management: GA combined w/ Regional for post-op pain   Induction:   PONV Risk Score and Plan:  Ondansetron, Dexamethasone, Midazolam and Treatment may vary due to age or medical condition  Airway Management Planned:   Additional Equipment:   Intra-op Plan:   Post-operative Plan:   Informed Consent: I have reviewed the patients History and Physical, chart, labs and discussed the procedure including the risks, benefits and alternatives for the proposed anesthesia with the patient or authorized representative who has indicated his/her understanding and acceptance.     Dental Advisory Given  Plan Discussed with: Anesthesiologist  Anesthesia Plan Comments:        Anesthesia Quick Evaluation

## 2019-04-20 NOTE — Anesthesia Postprocedure Evaluation (Signed)
Anesthesia Post Note  Patient: Gregory Mendoza  Procedure(s) Performed: RIGHT SHOULDER ARTHROSCOPY WITH DEBRIDEMENT, DECOMPRESSION, ROTATOR CUFF REPAIR, AND POSSIBLE BICEPS TENODESIS. (Right Shoulder)  Patient location during evaluation: PACU Anesthesia Type: General Level of consciousness: awake and alert Pain management: pain level controlled Vital Signs Assessment: post-procedure vital signs reviewed and stable Respiratory status: spontaneous breathing, nonlabored ventilation and respiratory function stable Cardiovascular status: blood pressure returned to baseline and stable Postop Assessment: no apparent nausea or vomiting Anesthetic complications: no     Last Vitals:  Vitals:   04/19/19 1557 04/19/19 1614  BP: (!) 159/79 (!) 157/71  Pulse: 83 85  Resp: 18 18  Temp: (!) 35.9 C (!) 36 C  SpO2: 94% 93%    Last Pain:  Vitals:   04/19/19 1614  TempSrc: Temporal  PainSc: 0-No pain                 Aurelio Brash Clarita Mcelvain

## 2019-04-22 DIAGNOSIS — M7521 Bicipital tendinitis, right shoulder: Secondary | ICD-10-CM | POA: Insufficient documentation

## 2019-04-25 DIAGNOSIS — M25511 Pain in right shoulder: Secondary | ICD-10-CM | POA: Diagnosis not present

## 2019-04-25 DIAGNOSIS — M6281 Muscle weakness (generalized): Secondary | ICD-10-CM | POA: Diagnosis not present

## 2019-04-25 DIAGNOSIS — G8929 Other chronic pain: Secondary | ICD-10-CM | POA: Diagnosis not present

## 2019-04-25 DIAGNOSIS — M25611 Stiffness of right shoulder, not elsewhere classified: Secondary | ICD-10-CM | POA: Diagnosis not present

## 2019-05-03 DIAGNOSIS — G8929 Other chronic pain: Secondary | ICD-10-CM | POA: Diagnosis not present

## 2019-05-03 DIAGNOSIS — M25511 Pain in right shoulder: Secondary | ICD-10-CM | POA: Diagnosis not present

## 2019-05-09 DIAGNOSIS — M25511 Pain in right shoulder: Secondary | ICD-10-CM | POA: Diagnosis not present

## 2019-05-09 DIAGNOSIS — G8929 Other chronic pain: Secondary | ICD-10-CM | POA: Diagnosis not present

## 2019-05-17 DIAGNOSIS — M25511 Pain in right shoulder: Secondary | ICD-10-CM | POA: Diagnosis not present

## 2019-05-17 DIAGNOSIS — G8929 Other chronic pain: Secondary | ICD-10-CM | POA: Diagnosis not present

## 2019-05-30 DIAGNOSIS — M25511 Pain in right shoulder: Secondary | ICD-10-CM | POA: Diagnosis not present

## 2019-05-30 DIAGNOSIS — G8929 Other chronic pain: Secondary | ICD-10-CM | POA: Diagnosis not present

## 2019-07-20 DIAGNOSIS — J449 Chronic obstructive pulmonary disease, unspecified: Secondary | ICD-10-CM | POA: Diagnosis not present

## 2019-07-20 DIAGNOSIS — I2510 Atherosclerotic heart disease of native coronary artery without angina pectoris: Secondary | ICD-10-CM | POA: Diagnosis not present

## 2019-07-20 DIAGNOSIS — Z794 Long term (current) use of insulin: Secondary | ICD-10-CM | POA: Diagnosis not present

## 2019-07-20 DIAGNOSIS — E118 Type 2 diabetes mellitus with unspecified complications: Secondary | ICD-10-CM | POA: Diagnosis not present

## 2019-07-27 DIAGNOSIS — I10 Essential (primary) hypertension: Secondary | ICD-10-CM | POA: Diagnosis not present

## 2019-07-27 DIAGNOSIS — Z794 Long term (current) use of insulin: Secondary | ICD-10-CM | POA: Diagnosis not present

## 2019-07-27 DIAGNOSIS — J449 Chronic obstructive pulmonary disease, unspecified: Secondary | ICD-10-CM | POA: Diagnosis not present

## 2019-07-27 DIAGNOSIS — Z0000 Encounter for general adult medical examination without abnormal findings: Secondary | ICD-10-CM | POA: Diagnosis not present

## 2019-07-27 DIAGNOSIS — I2510 Atherosclerotic heart disease of native coronary artery without angina pectoris: Secondary | ICD-10-CM | POA: Diagnosis not present

## 2019-07-27 DIAGNOSIS — E118 Type 2 diabetes mellitus with unspecified complications: Secondary | ICD-10-CM | POA: Diagnosis not present

## 2019-08-23 DIAGNOSIS — Z87891 Personal history of nicotine dependence: Secondary | ICD-10-CM | POA: Diagnosis not present

## 2019-08-23 DIAGNOSIS — Z794 Long term (current) use of insulin: Secondary | ICD-10-CM | POA: Diagnosis not present

## 2019-08-23 DIAGNOSIS — R21 Rash and other nonspecific skin eruption: Secondary | ICD-10-CM | POA: Diagnosis not present

## 2019-08-23 DIAGNOSIS — E119 Type 2 diabetes mellitus without complications: Secondary | ICD-10-CM | POA: Diagnosis not present

## 2019-09-02 NOTE — Progress Notes (Signed)
Patient: Gregory Mendoza  Service Category: E/M  Provider: Gaspar Cola, MD  DOB: 1946-02-07  DOS: 09/05/2019  Referring Provider: Kirk Ruths, MD  MRN: [id number]  Setting: Ambulatory outpatient  PCP: Kirk Ruths, MD  Type: New Patient  Specialty: Interventional Pain Management    Location: Office  Delivery: Face-to-face     Primary Reason(s) for Visit: Encounter for initial evaluation of one or more chronic problems (new to examiner) potentially causing chronic pain, and posing a threat to normal musculoskeletal function. (Level of risk: High) CC: Shoulder Pain (bilateral) and Neck Pain  HPI  [name] is a 75 y.o. year old, male patient, who comes today to see [place] for the first time for an initial evaluation of his chronic pain. He has Groin pain, right; Chest pain; NSTEMI (non-ST elevated myocardial infarction) [facility]); STEMI involving right coronary artery ([place]); Chronic pain syndrome; Pharmacologic therapy; Disorder of skeletal system; Problems influencing health status; Chronic back pain; COPD (chronic obstructive pulmonary disease) ([place]); Peripheral neuropathy; CAD (coronary artery disease), native coronary artery; Cardiac syncope; Chronic sciatica, right; COPD (chronic obstructive pulmonary disease) with chronic bronchitis (HCC); DM II (diabetes mellitus, type II), controlled ([place]); HTN (hypertension), benign; Nontraumatic incomplete tear of left rotator cuff; Rotator cuff tendinitis, left; Rotator cuff tendinitis, right; Tendinitis of upper biceps tendon of right shoulder; Health care maintenance; Cervical disc disorder with radiculopathy of cervical region; Cervical radiculopathy at C6 (Right); Chronic shoulder pain (Bilateral) (L>R); Cervicalgia; DDD (degenerative disc disease), cervical; Decreased range of motion of intervertebral discs of cervical spine; Impaired range of motion of both shoulders; Chronic neck pain (1ry area of Pain) (posterior) (Bilateral) (L>R); Radicular  pain of shoulder; Chronic anticoagulation (Plavix); 2 mm Anterolisthesis at C5-6; Cervical foraminal stenosis; and Cervical neural foraminal stenosis on their problem list. Today he comes in for evaluation of his Shoulder Pain (bilateral) and Neck Pain  Pain Assessment: Location: Right, Left Shoulder Radiating: numbness and tingling in right hand and fingers Onset: More than a month ago Duration: Chronic pain Quality: Constant Severity: 6 /10 (subjective, self-reported pain score)  Note: Reported level is compatible with observation.                         When using our objective Pain Scale, levels between 6 and 10/10 are said to belong in an emergency room, as it progressively worsens from a 6/10, described as severely limiting, requiring emergency care not usually available at an outpatient pain management facility. At a 6/10 level, communication becomes difficult and requires great effort. Assistance to reach the emergency department may be required. Facial flushing and profuse sweating along with potentially dangerous increases in heart rate and blood pressure will be evident. Effect on ADL: difficult ROM of right shoulder Timing: Constant Modifying factors: nothing BP: (!) 142/82  HR: 64  Onset and Duration: Present longer than 3 months Cause of pain: Unknown Timing: Not influenced by the time of the day Aggravating Factors: Working Alleviating Factors: Lying down, Medications and Warm showers or baths Associated Problems: Numbness and Tingling Quality of Pain: Annoying, Burning, Dull, Shooting and Uncomfortable Previous Examinations or Tests: MRI scan, Nerve block and X-rays Previous Treatments: Physical Therapy  The patient comes into the clinics today for the first time for a chronic pain management evaluation.  According to the patient the primary area of pain is that of the neck in the posterior aspect, bilaterally, (L>R) he denies any prior surgeries, recent x-rays, nerve  blocks,  or physical therapy.  He does admit to numbness over the right thumb and index finger following the distribution of C6.  The patient's secondary area pain is that of the shoulders, bilaterally, entry left more than right.  He does admit having had right shoulder surgery around February or March 2021 by Dr. Roland Rack for a rotator cuff repair.  The patient indicates having had bilateral shoulder MRIs and having had some intra-articular shoulder joint injections around December/January by Dr. Roland Rack.  He also does admit to having had some physical therapy around March 2021 which he describes as having helped to a certain degree.   Provocative physical exam today reveals right lower extremity pain, numbness, and weakness following the C6 distribution he also presents with bilateral decreased shoulder range of motion and pain.  Today I took the time to provide the patient with information regarding my pain practice. The patient was informed that my practice is divided into two sections: an interventional pain management section, as well as a completely separate and distinct medication management section. I explained that I have procedure days for my interventional therapies, and evaluation days for follow-ups and medication management. Because of the amount of documentation required during both, they are kept separated. This means that there is the possibility that he may be scheduled for a procedure on one day, and medication management the next. I have also informed him that because of staffing and facility limitations, I no longer take patients for medication management only. To illustrate the reasons for this, I gave the patient the example of surgeons, and how inappropriate it would be to refer a patient to his/her care, just to write for the post-surgical antibiotics on a surgery done by a different surgeon.   Because interventional pain management is my board-certified specialty, the patient was  informed that joining my practice means that they are open to any and all interventional therapies. I made it clear that this does not mean that they will be forced to have any procedures done. What this means is that I believe interventional therapies to be essential part of the diagnosis and proper management of chronic pain conditions. Therefore, patients not interested in these interventional alternatives will be better served under the care of a different practitioner.  The patient was also made aware of my Comprehensive Pain Management Safety Guidelines where by joining my practice, they limit all of their nerve blocks and joint injections to those done by our practice, for as long as we are retained to manage their care.   Historic Controlled Substance Pharmacotherapy Review  PMP and historical list of controlled substances: Hydrocodone/APAP 7.5/325; hydrocodone/APAP 5/325; tramadol 50 mg. Highest opioid analgesic regimen found: Hydrocodone/APAP 5/325, 1 tablet p.o. 4 times daily (40 mg/day of hydrocodone) (40 MME) Most recent opioid analgesic: Hydrocodone/APAP 7.5/325, 1 tablet p.o. 3 times daily (22.5 mg/day of hydrocodone) (22.5 MME/day) Current opioid analgesics:  None Highest recorded MME/day: 40 mg/day MME/day: 0 mg/day  Medications: The patient did not bring the medication(s) to the appointment, as requested in our "New Patient Package" Pharmacodynamics: Desired effects: Analgesia: The patient reports >50% benefit. Reported improvement in function: The patient reports medication allows him to accomplish basic ADLs. Clinically meaningful improvement in function (CMIF): Sustained CMIF goals met Perceived effectiveness: Described as relatively effective, allowing for increase in activities of daily living (ADL) Undesirable effects: Side-effects or Adverse reactions: None reported Historical Monitoring: The patient  reports no history of drug use. List of all UDS Test(s): No results  found. List of other Serum/Urine Drug Screening Test(s):  No results found. Historical Background Evaluation: [place] PMP: PDMP reviewed during this encounter. Online review of the past 39-monthperiod conducted.             PMP NARX Score Report:  Narcotic: 160 Sedative: 080 Stimulant: 000 [place] Department of public safety, offender search: (Editor, commissioningInformation) Non-contributory Risk Assessment Profile: Aberrant behavior: None observed or detected today Risk factors for fatal opioid overdose: None identified today PMP NARX Overdose Risk Score: 310 Fatal overdose hazard ratio (HR): Calculation deferred Non-fatal overdose hazard ratio (HR): Calculation deferred Risk of opioid abuse or dependence: 0.7-3.0% with doses ? 36 MME/day and 6.1-26% with doses ? 120 MME/day. Substance use disorder (SUD) risk level: See below Personal History of Substance Abuse (SUD-Substance use disorder):  Alcohol: Negative  Illegal Drugs: Negative  Rx Drugs: Negative  ORT Risk Level calculation: Low Risk  Opioid Risk Tool - 09/05/19 0836      Family History of Substance Abuse   Alcohol Negative    Illegal Drugs Negative    Rx Drugs Negative      Personal History of Substance Abuse   Alcohol Negative    Illegal Drugs Negative    Rx Drugs Negative      Age   Age between 130-45years  No      History of Preadolescent Sexual Abuse   History of Preadolescent Sexual Abuse Negative or Male      Psychological Disease   Psychological Disease Negative    Depression Negative      Total Score   Opioid Risk Tool Scoring 0    Opioid Risk Interpretation Low Risk          ORT Scoring interpretation table:  Score <3 = Low Risk for SUD  Score between 4-7 = Moderate Risk for SUD  Score >8 = High Risk for Opioid Abuse   PHQ-2 Depression Scale:  Total score: 0  PHQ-2 Scoring interpretation table: (Score and probability of major depressive disorder)  Score 0 = No depression  Score 1 = 15.4% Probability  Score 2  = 21.1% Probability  Score 3 = 38.4% Probability  Score 4 = 45.5% Probability  Score 5 = 56.4% Probability  Score 6 = 78.6% Probability   PHQ-9 Depression Scale:  Total score: 0  PHQ-9 Scoring interpretation table:  Score 0-4 = No depression  Score 5-9 = Mild depression  Score 10-14 = Moderate depression  Score 15-19 = Moderately severe depression  Score 20-27 = Severe depression (2.4 times higher risk of SUD and 2.89 times higher risk of overuse)   Pharmacologic Plan: As per protocol, I have not taken over any controlled substance management, pending the results of ordered tests and/or consults.            Initial impression: Pending review of available data and ordered tests.  Meds   Current Outpatient Medications:  .  acetaminophen (TYLENOL) 500 MG tablet, Take 500-1,000 mg by mouth every 6 (six) hours as needed for pain., Disp: , Rfl:  .  aspirin EC 81 MG tablet, Take 81 mg by mouth daily at 6 PM., Disp: , Rfl:  .  atorvastatin (LIPITOR) 10 MG tablet, Take 10 mg by mouth daily at 6 PM. , Disp: , Rfl:  .  clopidogrel (PLAVIX) 75 MG tablet, Take 75 mg by mouth daily., Disp: , Rfl:  .  gabapentin (NEURONTIN) 600 MG tablet, Take 600 mg by mouth 3 (three) times daily. ,  Disp: , Rfl:  .  glimepiride (AMARYL) 4 MG tablet, Take 4 mg by mouth daily. , Disp: , Rfl:  .  insulin NPH Human (HUMULIN N,NOVOLIN N) 100 UNIT/ML injection, Inject 10 Units into the skin 2 (two) times daily. , Disp: , Rfl:  .  isosorbide mononitrate (IMDUR) 30 MG 24 hr tablet, Take 30 mg by mouth daily at 6 PM. , Disp: , Rfl:  .  lisinopril (PRINIVIL,ZESTRIL) 10 MG tablet, Take 10 mg by mouth daily. , Disp: , Rfl:  .  metFORMIN (GLUCOPHAGE) 1000 MG tablet, Take 1,000 mg by mouth 2 (two) times daily with a meal. , Disp: , Rfl:  .  metoprolol (LOPRESSOR) 50 MG tablet, Take 50 mg by mouth 2 (two) times daily. , Disp: , Rfl:  .  Multiple Vitamin (MULTIVITAMIN WITH MINERALS) TABS tablet, Take 1 tablet by mouth daily.,  Disp: , Rfl:  .  nitroGLYCERIN (NITROSTAT) 0.3 MG SL tablet, Place 3 mg under the tongue daily as needed., Disp: , Rfl:  .  pioglitazone (ACTOS) 15 MG tablet, Take 15 mg by mouth at bedtime. , Disp: , Rfl:   Imaging Review  Shoulder Imaging: Shoulder-R MR wo contrast: Results for orders placed during the hospital encounter of 04/02/18  MR SHOULDER RIGHT WO CONTRAST  Narrative CLINICAL DATA:  Chronic shoulder pain and limited range of motion.  EXAM: MRI OF THE RIGHT SHOULDER WITHOUT CONTRAST  TECHNIQUE: Multiplanar, multisequence MR imaging of the shoulder was performed. No intravenous contrast was administered.  COMPARISON:  None.  FINDINGS: Rotator cuff: Moderate supraspinatus tendinosis with bursal and articular surface fraying. Mild infraspinatus and subscapularis tendinosis. The teres minor tendon is unremarkable.  Muscles: No atrophy or abnormal signal of the muscles of the rotator cuff.  Biceps long head: Moderate intra-articular tendinosis. Intact and normally positioned.  Acromioclavicular Joint: Moderate arthropathy of the acromioclavicular joint. Type I acromion. Small subacromial/subdeltoid bursal fluid.  Glenohumeral Joint: No joint effusion. Mild glenohumeral degenerative changes.  Labrum: Grossly intact, but evaluation is limited by lack of intraarticular fluid.  Bones:  No acute fracture or dislocation.  No focal bone lesion.  Other: None.  IMPRESSION: 1. Moderate supraspinatus tendinosis with bursal and articular surface fraying. 2. Mild infraspinatus and subscapularis tendinosis. 3. Moderate intra-articular biceps tendinosis. 4. Mild glenohumeral and moderate acromioclavicular osteoarthritis.   Electronically Signed By: Titus Dubin M.D. On: 04/02/2018 11:54  Shoulder-L MR wo contrast: Results for orders placed during the hospital encounter of 04/02/18  MR SHOULDER LEFT WO CONTRAST  Narrative CLINICAL DATA:  Chronic shoulder pain and  limited range of motion for the past few months. No injury.  EXAM: MRI OF THE LEFT SHOULDER WITHOUT CONTRAST  TECHNIQUE: Multiplanar, multisequence MR imaging of the shoulder was performed. No intravenous contrast was administered.  COMPARISON:  MRI left shoulder dated March 20, 2008.  FINDINGS: Rotator cuff: Progressive moderate supraspinatus tendinosis with prominent irregularity and probable high-grade partial-thickness bursal surface tearing of the posterior tendon. Progressive moderate infraspinatus and subscapularis tendinosis. The teres minor tendon is unremarkable.  Muscles: No atrophy or abnormal signal of the muscles of the rotator cuff.  Biceps long head: Progressive moderate intra-articular tendinosis. Intact and normally positioned.  Acromioclavicular Joint: Moderate arthropathy of the acromioclavicular joint. Type I acromion. No significant subacromial/subdeltoid bursal fluid.  Glenohumeral Joint: No joint effusion. Mild glenohumeral degenerative changes.  Labrum: Grossly intact, but evaluation is limited by lack of intraarticular fluid.  Bones:  No acute fracture or dislocation.  No focal bone lesion.  Other: None.  IMPRESSION: 1. Progressive moderate supraspinatus tendinosis with prominent irregularity and new probable high-grade partial-thickness bursal surface tearing of the posterior tendon. 2. Progressive moderate infraspinatus and subscapularis tendinosis. 3. Progressive moderate intra-articular biceps tendinosis. 4. Progressive mild glenohumeral and moderate acromioclavicular osteoarthritis.   Electronically Signed By: Titus Dubin M.D. On: 04/02/2018 11:37  Ankle Imaging: Ankle-R DG Complete: Results for orders placed during the hospital encounter of 10/17/16  DG Ankle Complete Right  Narrative CLINICAL DATA:  Fall from ladder.  Ankle and foot pain.  EXAM: RIGHT ANKLE - COMPLETE 3+ VIEW  COMPARISON:  None.  FINDINGS: Three  views study shows an acute tiny cortical avulsion injury at the medial malleolus. Chronic avulsion fragments identified adjacent to the lateral malleolus. Ankle mortise is preserved. No subluxation or dislocation. Soft tissue swelling is evident.  IMPRESSION: Acute tiny cortical avulsion injury at the medial malleolus.   Electronically Signed By: Misty Stanley M.D. On: 10/17/2016 18:29  Foot Imaging: Foot-R DG Complete: Results for orders placed during the hospital encounter of 10/17/16  DG Foot Complete Right  Narrative CLINICAL DATA:  Patient fell from ladder.  Foot and ankle pain.  EXAM: RIGHT FOOT COMPLETE - 3+ VIEW  COMPARISON:  None.  FINDINGS: There is no evidence of fracture or dislocation. There is no evidence of arthropathy or other focal bone abnormality. Soft tissues are unremarkable.  IMPRESSION: Negative.   Electronically Signed By: Misty Stanley M.D. On: 10/17/2016 18:28  Complexity Note: Imaging results reviewed. Results shared with Mr. Bellanger, using [employer].                        ROS  Cardiovascular: Heart trouble, Daily Aspirin intake, High blood pressure, Chest pain and Heart attack ( Date: has had 3 MI's) Pulmonary or Respiratory: Shortness of breath Neurological: No reported neurological signs or symptoms such as seizures, abnormal skin sensations, urinary and/or fecal incontinence, being born with an abnormal open spine and/or a tethered spinal cord Psychological-Psychiatric: No reported psychological or psychiatric signs or symptoms such as difficulty sleeping, anxiety, depression, delusions or hallucinations (schizophrenial), mood swings (bipolar disorders) or suicidal ideations or attempts Gastrointestinal: Vomiting blood (Ulcers) Genitourinary: No reported renal or genitourinary signs or symptoms such as difficulty voiding or producing urine, peeing blood, non-functioning kidney, kidney stones, difficulty emptying the bladder,  difficulty controlling the flow of urine, or chronic kidney disease Hematological: Brusing easily and Bleeding easily Endocrine: High blood sugar controlled without the use of insulin (NIDDM) Rheumatologic: No reported rheumatological signs and symptoms such as fatigue, joint pain, tenderness, swelling, redness, heat, stiffness, decreased range of motion, with or without associated rash Musculoskeletal: Negative for myasthenia gravis, muscular dystrophy, multiple sclerosis or malignant hyperthermia Work History: Retired  Allergies  Mr. Funes has No Known Allergies.  Laboratory Chemistry Profile   Renal Lab Results  Component Value Date   BUN 16 03/15/2019   CREATININE 0.86 03/15/2019   GFRAA >60 03/15/2019   GFRNONAA >60 03/15/2019     Electrolytes Lab Results  Component Value Date   NA 139 03/15/2019   K 4.1 03/15/2019   CL 100 03/15/2019   CALCIUM 9.5 03/15/2019     Hepatic No results found for: AST, ALT, ALBUMIN, ALKPHOS, AMYLASE, LIPASE, AMMONIA   ID Lab Results  Component Value Date   [place] NEGATIVE 04/15/2019   MRSAPCR NEGATIVE 12/18/2017     Bone No results found.   Endocrine Lab Results  Component Value Date  GLUCOSE 142 (H) 03/15/2019     Neuropathy No results found.   CNS No results found.   Inflammation (CRP: Acute  ESR: Chronic) No results found.   Rheumatology No results found.   Coagulation Lab Results  Component Value Date   INR 0.98 12/13/2017   LABPROT 12.9 12/13/2017   APTT 46 (H) 12/13/2017   PLT 336 03/15/2019     Cardiovascular Lab Results  Component Value Date   CKMB 2.6 12/17/2017   TROPONINI 9.89 (HH) 12/18/2017   HGB 15.2 03/15/2019   HCT 47.9 03/15/2019     Screening Lab Results  Component Value Date   SARSCOV2NAA NEGATIVE 04/15/2019   MRSAPCR NEGATIVE 12/18/2017     Cancer No results found.  Allergens No results found.     Note: Lab results reviewed.   [place]  Drug: Mr. Alcocer  reports no history  of drug use. Alcohol:  reports no history of alcohol use. Tobacco:  reports that he quit smoking about 21 years ago. He quit after 40.00 years of use. He has never used smokeless tobacco. Medical:  has a past medical history of Arthritis, Chronic back pain, Diabetes mellitus without complication ([place]), Dyspnea, Dysrhythmia, Hyperlipidemia, Hypertension, Myocardial infarction ([place]) (2007, 2009), and Peripheral neuropathy. Family: family history is not on file.  Past Surgical History:  Procedure Laterality Date  . APPENDECTOMY  1970's  . BACK SURGERY  1997   Discectomy L4-L5  . CARDIAC CATHETERIZATION    . COLONOSCOPY  2017  . CORONARY STENT INTERVENTION N/A 12/14/2017   Procedure: CORONARY STENT INTERVENTION;  Surgeon: Yolonda Kida, MD;  Location: [place] [facility];  Service: Cardiovascular;  Laterality: N/A;  . CORONARY/GRAFT ACUTE MI REVASCULARIZATION N/A 12/18/2017   Procedure: Coronary/Graft Acute MI Revascularization;  Surgeon: Yolonda Kida, MD;  Location: [place] [facility];  Service: Cardiovascular;  Laterality: N/A;  . HERNIA REPAIR Left 02/08/2008   Dr Bary Castilla  . INGUINAL HERNIA REPAIR Right 1999   with mesh while in [place]  . LEFT HEART CATH AND CORONARY ANGIOGRAPHY N/A 12/14/2017   Procedure: LEFT HEART CATH AND CORONARY ANGIOGRAPHY;  Surgeon: Corey Skains, MD;  Location: [place] [facility];  Service: Cardiovascular;  Laterality: N/A;  . LEFT HEART CATH AND CORONARY ANGIOGRAPHY N/A 12/18/2017   Procedure: LEFT HEART CATH AND CORONARY ANGIOGRAPHY;  Surgeon: Yolonda Kida, MD;  Location: [place] [facility];  Service: Cardiovascular;  Laterality: N/A;  . SHOULDER ARTHROSCOPY WITH ROTATOR CUFF REPAIR AND OPEN BICEPS TENODESIS Right 04/19/2019   Procedure: RIGHT SHOULDER ARTHROSCOPY WITH DEBRIDEMENT, DECOMPRESSION, ROTATOR CUFF REPAIR, AND POSSIBLE BICEPS TENODESIS.;  Surgeon: Corky Mull, MD;  Location: [facility];  Service: Orthopedics;  Laterality:  Right;  . trigger thumb Right    Active Ambulatory Problems    Diagnosis Date Noted  . Groin pain, right 03/04/2016  . Chest pain 12/12/2017  . NSTEMI (non-ST elevated myocardial infarction) ([place]) 12/13/2017  . STEMI involving right coronary artery ([place]) 12/18/2017  . Chronic pain syndrome 09/04/2019  . Pharmacologic therapy 09/04/2019  . Disorder of skeletal system 09/04/2019  . Problems influencing health status 09/04/2019  . Chronic back pain   . COPD (chronic obstructive pulmonary disease) ([place])   . Peripheral neuropathy   . CAD (coronary artery disease), native coronary artery 11/12/2013  . Cardiac syncope 10/10/2014  . Chronic sciatica, right 04/04/2014  . COPD (chronic obstructive pulmonary disease) with chronic bronchitis ([place]) 11/12/2013  . DM II (diabetes mellitus, type II), controlled ([place]) 11/12/2013  .  HTN (hypertension), benign 11/12/2013  . Nontraumatic incomplete tear of left rotator cuff 04/26/2018  . Rotator cuff tendinitis, left 04/26/2018  . Rotator cuff tendinitis, right 04/26/2018  . Tendinitis of upper biceps tendon of right shoulder 04/22/2019  . Health care maintenance 06/07/2015  . Cervical disc disorder with radiculopathy of cervical region 09/05/2019  . Cervical radiculopathy at C6 (Right) 09/05/2019  . Chronic shoulder pain (Bilateral) (L>R) 09/05/2019  . Cervicalgia 09/05/2019  . DDD (degenerative disc disease), cervical 09/05/2019  . Decreased range of motion of intervertebral discs of cervical spine 09/05/2019  . Impaired range of motion of both shoulders 09/05/2019  . Chronic neck pain (1ry area of Pain) (posterior) (Bilateral) (L>R) 09/05/2019  . Radicular pain of shoulder 09/05/2019  . Chronic anticoagulation (Plavix) 09/05/2019  . 2 mm Anterolisthesis at C5-6 09/05/2019  . Cervical foraminal stenosis 09/05/2019  . Cervical neural foraminal stenosis 09/05/2019   Resolved Ambulatory Problems    Diagnosis Date Noted  . No Resolved Ambulatory  Problems   Past Medical History:  Diagnosis Date  . Arthritis   . Diabetes mellitus without complication ([place])   . Dyspnea   . Dysrhythmia   . Hyperlipidemia   . Hypertension   . Myocardial infarction [facility]) 2007, 2009   Constitutional Exam  General appearance: Well nourished, well developed, and well hydrated. In no apparent acute distress Vitals:   09/05/19 0828  BP: (!) 142/82  Pulse: 64  Resp: 14  Temp: (!) 97.5 F (36.4 C)  TempSrc: Temporal  SpO2: 99%  Weight: 155 lb (70.3 kg)  Height: _0  (1.676 m)   BMI Assessment: Estimated body mass index is 25.02 kg/m as calculated from the following:   Height as of this encounter: _1  (1.676 m).   Weight as of this encounter: 155 lb (70.3 kg).  BMI interpretation table: BMI level Category Range association with higher incidence of chronic pain  <18 kg/m2 Underweight   18.5-24.9 kg/m2 Ideal body weight   25-29.9 kg/m2 Overweight Increased incidence by 20%  30-34.9 kg/m2 Obese (Class I) Increased incidence by 68%  35-39.9 kg/m2 Severe obesity (Class II) Increased incidence by 136%  >40 kg/m2 Extreme obesity (Class III) Increased incidence by 254%   Patient's current BMI Ideal Body weight  Body mass index is 25.02 kg/m. Ideal body weight: 63.8 kg (140 lb 10.5 oz) Adjusted ideal body weight: 66.4 kg (146 lb 6.3 oz)   BMI Readings from Last 4 Encounters:  09/05/19 25.02 kg/m  04/19/19 25.82 kg/m  05/10/18 26.63 kg/m  12/20/17 25.61 kg/m   Wt Readings from Last 4 Encounters:  09/05/19 155 lb (70.3 kg)  04/19/19 160 lb (72.6 kg)  05/10/18 165 lb (74.8 kg)  12/20/17 158 lb 11.2 oz (72 kg)    Psych/Mental status: Alert, oriented x 3 (person, place, & time)       Eyes: PERLA Respiratory: No evidence of acute respiratory distress  Cervical Spine Exam  Skin & Axial Inspection: No masses, redness, edema, swelling, or associated skin lesions Alignment: Symmetrical Functional ROM: Decreased ROM,  bilaterally Stability: No instability detected Muscle Tone/Strength: Guarding observed Sensory (Neurological): Movement-associated pain Palpation: Complains of area being tender to palpation              Upper Extremity (UE) Exam    Side: Right upper extremity  Side: Left upper extremity  Skin & Extremity Inspection: Skin color, temperature, and hair growth are WNL. No peripheral edema or cyanosis. No masses, redness, swelling, asymmetry, or associated skin  lesions. No contractures.  Skin & Extremity Inspection: Skin color, temperature, and hair growth are WNL. No peripheral edema or cyanosis. No masses, redness, swelling, asymmetry, or associated skin lesions. No contractures.  Functional ROM: Decreased ROM for shoulder  Functional ROM: Decreased ROM for shoulder  Muscle Tone/Strength: C6 myotomal weakness (Wrist Extension)   Muscle Tone/Strength: Functionally intact. No obvious neuro-muscular anomalies detected.  Sensory (Neurological): Dermatomal pain pattern C6.  In addition the patient presents with a shoulder articular pain pattern as well and was dermatomal numbness.  Sensory (Neurological): Articular pain pattern affecting the shoulder  Palpation: Complains of area being tender to palpation              Palpation: Complains of area being tender to palpation              Provocative Test(s):  Phalen's test: deferred Tinel's test: deferred Apley's scratch test (touch opposite shoulder):  Action 1 (Across chest): Decreased ROM Action 2 (Overhead): Decreased ROM Action 3 (LB reach): Decreased ROM   Provocative Test(s):  Phalen's test: deferred Tinel's test: deferred Apley's scratch test (touch opposite shoulder):  Action 1 (Across chest): Decreased ROM Action 2 (Overhead): Decreased ROM Action 3 (LB reach): Decreased ROM    Assessment  Primary Diagnosis & Pertinent Problem List: The primary encounter diagnosis was Chronic pain syndrome. Diagnoses of Chronic shoulder pain  (Bilateral) (L>R), Cervical disc disorder with radiculopathy of cervical region, 2 mm Anterolisthesis at C5-6, Cervical neural foraminal stenosis, Cervical radiculopathy at C6 (Right), Cervicalgia, DDD (degenerative disc disease), cervical, Decreased range of motion of intervertebral discs of cervical spine, Chronic neck pain (1ry area of Pain) (posterior) (Bilateral) (L>R), Impaired range of motion of both shoulders, Radicular pain of shoulder, Cervical foraminal stenosis, Chronic anticoagulation (Plavix), Pharmacologic therapy, Disorder of skeletal system, and Problems influencing health status were also pertinent to this visit.  Visit Diagnosis (New problems to examiner): 1. Chronic pain syndrome   2. Chronic shoulder pain (Bilateral) (L>R)   3. Cervical disc disorder with radiculopathy of cervical region   4. 2 mm Anterolisthesis at C5-6   5. Cervical neural foraminal stenosis   6. Cervical radiculopathy at C6 (Right)   7. Cervicalgia   8. DDD (degenerative disc disease), cervical   9. Decreased range of motion of intervertebral discs of cervical spine   10. Chronic neck pain (1ry area of Pain) (posterior) (Bilateral) (L>R)   11. Impaired range of motion of both shoulders   12. Radicular pain of shoulder   13. Cervical foraminal stenosis   14. Chronic anticoagulation (Plavix)   15. Pharmacologic therapy   16. Disorder of skeletal system   17. Problems influencing health status    Plan of Care (Initial workup plan)  Note: Mr. Willmon was reminded that as per protocol, today's visit has been an evaluation only. We have not taken over the patient's controlled substance management.  Problem-specific plan: No problem-specific Assessment & Plan notes found for this encounter.   Lab Orders     Compliance Drug Analysis, Ur     Comp. Metabolic Panel (12)     Magnesium     Vitamin B12     Sedimentation rate     25-Hydroxy vitamin D Lcms D2+D3     C-reactive protein     Protime-INR      APTT     Platelet count     Platelet function assay  Imaging Orders     DG Cervical Spine With Flex & Extend  MR CERVICAL SPINE WO CONTRAST Referral Orders  No referral(s) requested today    Procedure Orders     SUPRASCAPULAR NERVE BLOCK Pharmacotherapy (current): Medications ordered:  No orders of the defined types were placed in this encounter.  Medications administered during this visit: Loys Shugars had no medications administered during this visit.   Pharmacological management options:  Opioid Analgesics: The patient was informed that there is no guarantee that he would be a candidate for opioid analgesics. The decision will be made following CDC guidelines. This decision will be based on the results of diagnostic studies, as well as Mr. Leete's risk profile.   Membrane stabilizer: To be determined at a later time  Muscle relaxant: To be determined at a later time  NSAID: To be determined at a later time  Other analgesic(s): To be determined at a later time   Interventional management options: Mr. Wiedeman was informed that there is no guarantee that he would be a candidate for interventional therapies. The decision will be based on the results of diagnostic studies, as well as Mr. Fennewald's risk profile.  Procedure(s) under consideration:  NOTE: PLAVIX ANTICOAGULATION (Stop: 7 days  Restart: 2 hours) Diagnostic bilateral suprascapular nerve block #1  Possible bilateral suprascapular nerve RFA  Diagnostic left cervical ESI #1  Diagnostic bilateral cervical facet block    Provider-requested follow-up: Return for Procedure (w/ sedation): (B) SSNB #1 (Blood-thinner Protocol).  No future appointments.  Note by: Gaspar Cola, MD Date: 09/05/2019; Time: 4:57 PM

## 2019-09-04 DIAGNOSIS — Z79899 Other long term (current) drug therapy: Secondary | ICD-10-CM | POA: Insufficient documentation

## 2019-09-04 DIAGNOSIS — G8929 Other chronic pain: Secondary | ICD-10-CM | POA: Insufficient documentation

## 2019-09-04 DIAGNOSIS — G629 Polyneuropathy, unspecified: Secondary | ICD-10-CM | POA: Insufficient documentation

## 2019-09-04 DIAGNOSIS — M899 Disorder of bone, unspecified: Secondary | ICD-10-CM | POA: Insufficient documentation

## 2019-09-04 DIAGNOSIS — Z789 Other specified health status: Secondary | ICD-10-CM | POA: Insufficient documentation

## 2019-09-04 DIAGNOSIS — M549 Dorsalgia, unspecified: Secondary | ICD-10-CM | POA: Insufficient documentation

## 2019-09-04 DIAGNOSIS — J449 Chronic obstructive pulmonary disease, unspecified: Secondary | ICD-10-CM | POA: Insufficient documentation

## 2019-09-04 DIAGNOSIS — G894 Chronic pain syndrome: Secondary | ICD-10-CM | POA: Insufficient documentation

## 2019-09-04 NOTE — Patient Instructions (Addendum)
____________________________________________________________________________________________  General Risks and Possible Complications  Patient Responsibilities: It is important that you read this as it is part of your informed consent. It is our duty to inform you of the risks and possible complications associated with treatments offered to you. It is your responsibility as a patient to read this and to ask questions about anything that is not clear or that you believe was not covered in this document.  Patient's Rights: You have the right to refuse treatment. You also have the right to change your mind, even after initially having agreed to have the treatment done. However, under this last option, if you wait until the last second to change your mind, you may be charged for the materials used up to that point.  Introduction: Medicine is not an exact science. Everything in Medicine, including the lack of treatment(s), carries the potential for danger, harm, or loss (which is by definition: Risk). In Medicine, a complication is a secondary problem, condition, or disease that can aggravate an already existing one. All treatments carry the risk of possible complications. The fact that a side effects or complications occurs, does not imply that the treatment was conducted incorrectly. It must be clearly understood that these can happen even when everything is done following the highest safety standards.  No treatment: You can choose not to proceed with the proposed treatment alternative. The "PRO(s)" would include: avoiding the risk of complications associated with the therapy. The "CON(s)" would include: not getting any of the treatment benefits. These benefits fall under one of three categories: diagnostic; therapeutic; and/or palliative. Diagnostic benefits include: getting information which can ultimately lead to improvement of the disease or symptom(s). Therapeutic benefits are those associated with the  successful treatment of the disease. Finally, palliative benefits are those related to the decrease of the primary symptoms, without necessarily curing the condition (example: decreasing the pain from a flare-up of a chronic condition, such as incurable terminal cancer).  General Risks and Complications: These are associated to most interventional treatments. They can occur alone, or in combination. They fall under one of the following six (6) categories: no benefit or worsening of symptoms; bleeding; infection; nerve damage; allergic reactions; and/or death. 1. No benefits or worsening of symptoms: In Medicine there are no guarantees, only probabilities. No healthcare provider can ever guarantee that a medical treatment will work, they can only state the probability that it may. Furthermore, there is always the possibility that the condition may worsen, either directly, or indirectly, as a consequence of the treatment. 2. Bleeding: This is more common if the patient is taking a blood thinner, either prescription or over the counter (example: Goody Powders, Fish oil, Aspirin, Garlic, etc.), or if suffering a condition associated with impaired coagulation (example: Hemophilia, cirrhosis of the liver, low platelet counts, etc.). However, even if you do not have one on these, it can still happen. If you have any of these conditions, or take one of these drugs, make sure to notify your treating physician. 3. Infection: This is more common in patients with a compromised immune system, either due to disease (example: diabetes, cancer, human immunodeficiency virus [HIV], etc.), or due to medications or treatments (example: therapies used to treat cancer and rheumatological diseases). However, even if you do not have one on these, it can still happen. If you have any of these conditions, or take one of these drugs, make sure to notify your treating physician. 4. Nerve Damage: This is more common when the   treatment is  an invasive one, but it can also happen with the use of medications, such as those used in the treatment of cancer. The damage can occur to small secondary nerves, or to large primary ones, such as those in the spinal cord and brain. This damage may be temporary or permanent and it may lead to impairments that can range from temporary numbness to permanent paralysis and/or brain death. 5. Allergic Reactions: Any time a substance or material comes in contact with our body, there is the possibility of an allergic reaction. These can range from a mild skin rash (contact dermatitis) to a severe systemic reaction (anaphylactic reaction), which can result in death. 6. Death: In general, any medical intervention can result in death, most of the time due to an unforeseen complication. ____________________________________________________________________________________________  ____________________________________________________________________________________________  Blood Thinners  IMPORTANT NOTICE:  If you take any of these, make sure to notify the nursing staff.  Failure to do so may result in injury.  Recommended time intervals to stop and restart blood-thinners, before & after invasive procedures  Generic Name Brand Name Stop Time. Must be stopped at least this long before procedures. After procedures, wait at least this long before re-starting.  Abciximab Reopro 15 days 2 hrs  Alteplase Activase 10 days 10 days  Anagrelide Agrylin    Apixaban Eliquis 3 days 6 hrs  Cilostazol Pletal 3 days 5 hrs  Clopidogrel Plavix 7-10 days 2 hrs  Dabigatran Pradaxa 5 days 6 hrs  Dalteparin Fragmin 24 hours 4 hrs  Dipyridamole Aggrenox 11days 2 hrs  Edoxaban Lixiana; Savaysa 3 days 2 hrs  Enoxaparin  Lovenox 24 hours 4 hrs  Eptifibatide Integrillin 8 hours 2 hrs  Fondaparinux  Arixtra 72 hours 12 hrs  Prasugrel Effient 7-10 days 6 hrs  Reteplase Retavase 10 days 10 days  Rivaroxaban Xarelto 3 days 6 hrs   Ticagrelor Brilinta 5-7 days 6 hrs  Ticlopidine Ticlid 10-14 days 2 hrs  Tinzaparin Innohep 24 hours 4 hrs  Tirofiban Aggrastat 8 hours 2 hrs  Warfarin Coumadin 5 days 2 hrs   Other medications with blood-thinning effects  Product indications Generic (Brand) names Note  Cholesterol Lipitor Stop 4 days before procedure  Blood thinner (injectable) Heparin (LMW or LMWH Heparin) Stop 24 hours before procedure  Cancer Ibrutinib (Imbruvica) Stop 7 days before procedure  Malaria/Rheumatoid Hydroxychloroquine (Plaquenil) Stop 11 days before procedure  Thrombolytics  10 days before or after procedures   Over-the-counter (OTC) Products with blood-thinning effects  Product Common names Stop Time  Aspirin > 325 mg Goody Powders, Excedrin, etc. 11 days  Aspirin ? 81 mg  7 days  Fish oil  4 days  Garlic supplements  7 days  Ginkgo biloba  36 hours  Ginseng  24 hours  NSAIDs Ibuprofen, Naprosyn, etc. 3 days  Vitamin E  4 days   ____________________________________________________________________________________________  ____________________________________________________________________________________________  Preparing for Procedure with Sedation  Procedure appointments are limited to planned procedures: . No Prescription Refills. . No disability issues will be discussed. . No medication changes will be discussed.  Instructions: . Oral Intake: Do not eat or drink anything for at least 8 hours prior to your procedure. (Exception: Blood Pressure Medication. See below.) . Transportation: Unless otherwise stated by your physician, you may drive yourself after the procedure. . Blood Pressure Medicine: Do not forget to take your blood pressure medicine with a sip of water the morning of the procedure. If your Diastolic (lower reading)is above 100 mmHg, elective cases will  be cancelled/rescheduled. . Blood thinners: These will need to be stopped for procedures. Notify our staff if you are  taking any blood thinners. Depending on which one you take, there will be specific instructions on how and when to stop it. . Diabetics on insulin: Notify the staff so that you can be scheduled 1st case in the morning. If your diabetes requires high dose insulin, take only  of your normal insulin dose the morning of the procedure and notify the staff that you have done so. . Preventing infections: Shower with an antibacterial soap the morning of your procedure. . Build-up your immune system: Take 1000 mg of Vitamin C with every meal (3 times a day) the day prior to your procedure. Marland Kitchen Antibiotics: Inform the staff if you have a condition or reason that requires you to take antibiotics before dental procedures. . Pregnancy: If you are pregnant, call and cancel the procedure. . Sickness: If you have a cold, fever, or any active infections, call and cancel the procedure. . Arrival: You must be in the facility at least 30 minutes prior to your scheduled procedure. . Children: Do not bring children with you. . Dress appropriately: Bring dark clothing that you would not mind if they get stained. . Valuables: Do not bring any jewelry or valuables.  Reasons to call and reschedule or cancel your procedure: (Following these recommendations will minimize the risk of a serious complication.) . Surgeries: Avoid having procedures within 2 weeks of any surgery. (Avoid for 2 weeks before or after any surgery). . Flu Shots: Avoid having procedures within 2 weeks of a flu shots or . (Avoid for 2 weeks before or after immunizations). . Barium: Avoid having a procedure within 7-10 days after having had a radiological study involving the use of radiological contrast. (Myelograms, Barium swallow or enema study). . Heart attacks: Avoid any elective procedures or surgeries for the initial 6 months after a "Myocardial Infarction" (Heart Attack). . Blood thinners: It is imperative that you stop these medications before  procedures. Let us know if you if you take any blood thinner.  . Infection: Avoid procedures during or within two weeks of an infection (including chest colds or gastrointestinal problems). Symptoms associated with infections include: Localized redness, fever, chills, night sweats or profuse sweating, burning sensation when voiding, cough, congestion, stuffiness, runny nose, sore throat, diarrhea, nausea, vomiting, cold or Flu symptoms, recent or current infections. It is specially important if the infection is over the area that we intend to treat. Marland Kitchen Heart and lung problems: Symptoms that may suggest an active cardiopulmonary problem include: cough, chest pain, breathing difficulties or shortness of breath, dizziness, ankle swelling, uncontrolled high or unusually low blood pressure, and/or palpitations. If you are experiencing any of these symptoms, cancel your procedure and contact your primary care physician for an evaluation.  Remember:  Regular Business hours are:  Monday to Thursday 8:00 AM to 4:00 PM  Provider's Schedule: Delano Metz, MD:  Procedure days: Tuesday and Thursday 7:30 AM to 4:00 PM  Edward Jolly, MD:  Procedure days: Monday and Wednesday 7:30 AM to 4:00 PM ____________________________________________________________________________________________ We will contact Dr. Cassie Freer to get clearance to stop Plavix 7 days.

## 2019-09-05 ENCOUNTER — Encounter: Payer: Self-pay | Admitting: Pain Medicine

## 2019-09-05 ENCOUNTER — Ambulatory Visit: Payer: Medicare HMO | Admitting: Pain Medicine

## 2019-09-05 ENCOUNTER — Other Ambulatory Visit: Payer: Self-pay

## 2019-09-05 ENCOUNTER — Ambulatory Visit
Admission: RE | Admit: 2019-09-05 | Discharge: 2019-09-05 | Disposition: A | Discharge status: Home / Self Care | Payer: Medicare HMO | Source: Ambulatory Visit | Attending: Pain Medicine | Admitting: Pain Medicine

## 2019-09-05 ENCOUNTER — Ambulatory Visit
Admission: RE | Admit: 2019-09-05 | Discharge: 2019-09-05 | Disposition: A | Discharge status: Home / Self Care | Payer: Medicare HMO | Attending: Pain Medicine | Admitting: Pain Medicine

## 2019-09-05 VITALS — BP 142/82 | HR 64 | Temp 97.5°F | Resp 14 | Ht 66.0 in | Wt 155.0 lb

## 2019-09-05 DIAGNOSIS — Z789 Other specified health status: Secondary | ICD-10-CM

## 2019-09-05 DIAGNOSIS — M5382 Other specified dorsopathies, cervical region: Secondary | ICD-10-CM

## 2019-09-05 DIAGNOSIS — M5412 Radiculopathy, cervical region: Secondary | ICD-10-CM

## 2019-09-05 DIAGNOSIS — M542 Cervicalgia: Secondary | ICD-10-CM

## 2019-09-05 DIAGNOSIS — Z79899 Other long term (current) drug therapy: Secondary | ICD-10-CM | POA: Diagnosis not present

## 2019-09-05 DIAGNOSIS — G8929 Other chronic pain: Secondary | ICD-10-CM | POA: Insufficient documentation

## 2019-09-05 DIAGNOSIS — M25611 Stiffness of right shoulder, not elsewhere classified: Secondary | ICD-10-CM | POA: Insufficient documentation

## 2019-09-05 DIAGNOSIS — M25511 Pain in right shoulder: Secondary | ICD-10-CM

## 2019-09-05 DIAGNOSIS — M5030 Other cervical disc degeneration, unspecified cervical region: Secondary | ICD-10-CM | POA: Insufficient documentation

## 2019-09-05 DIAGNOSIS — M4802 Spinal stenosis, cervical region: Secondary | ICD-10-CM

## 2019-09-05 DIAGNOSIS — G894 Chronic pain syndrome: Secondary | ICD-10-CM

## 2019-09-05 DIAGNOSIS — M5010 Cervical disc disorder with radiculopathy, unspecified cervical region: Secondary | ICD-10-CM | POA: Insufficient documentation

## 2019-09-05 DIAGNOSIS — M4312 Spondylolisthesis, cervical region: Secondary | ICD-10-CM | POA: Diagnosis not present

## 2019-09-05 DIAGNOSIS — Z7901 Long term (current) use of anticoagulants: Secondary | ICD-10-CM | POA: Insufficient documentation

## 2019-09-05 DIAGNOSIS — M25512 Pain in left shoulder: Secondary | ICD-10-CM

## 2019-09-05 DIAGNOSIS — M899 Disorder of bone, unspecified: Secondary | ICD-10-CM | POA: Insufficient documentation

## 2019-09-05 DIAGNOSIS — M4310 Spondylolisthesis, site unspecified: Secondary | ICD-10-CM | POA: Insufficient documentation

## 2019-09-05 DIAGNOSIS — M9971 Connective tissue and disc stenosis of intervertebral foramina of cervical region: Secondary | ICD-10-CM | POA: Insufficient documentation

## 2019-09-05 DIAGNOSIS — M47812 Spondylosis without myelopathy or radiculopathy, cervical region: Secondary | ICD-10-CM | POA: Diagnosis not present

## 2019-09-05 DIAGNOSIS — M25612 Stiffness of left shoulder, not elsewhere classified: Secondary | ICD-10-CM

## 2019-09-05 NOTE — Progress Notes (Signed)
Safety precautions to be maintained throughout the outpatient stay will include: orient to surroundings, keep bed in low position, maintain call bell within reach at all times, provide assistance with transfer out of bed and ambulation.  

## 2019-09-06 ENCOUNTER — Telehealth: Payer: Self-pay | Admitting: *Deleted

## 2019-09-06 LAB — PROTIME-INR
INR: 1 (ref 0.9–1.2)
Prothrombin Time: 10.3 s (ref 9.1–12.0)

## 2019-09-06 LAB — PLATELET COUNT: Platelets: 329 10*3/uL (ref 150–450)

## 2019-09-06 LAB — APTT: aPTT: 30 s (ref 24–33)

## 2019-09-09 LAB — COMPLIANCE DRUG ANALYSIS, UR

## 2019-09-13 LAB — COMP. METABOLIC PANEL (12)
AST: 17 IU/L (ref 0–40)
Albumin/Globulin Ratio: 2 (ref 1.2–2.2)
Albumin: 4.5 g/dL (ref 3.7–4.7)
Alkaline Phosphatase: 57 IU/L (ref 48–121)
BUN/Creatinine Ratio: 16 (ref 10–24)
BUN: 14 mg/dL (ref 8–27)
Bilirubin Total: 0.3 mg/dL (ref 0.0–1.2)
Calcium: 9.5 mg/dL (ref 8.6–10.2)
Chloride: 105 mmol/L (ref 96–106)
Creatinine, Ser: 0.9 mg/dL (ref 0.76–1.27)
GFR calc Af Amer: 97 mL/min/{1.73_m2} (ref >59)
GFR calc non Af Amer: 84 mL/min/{1.73_m2} (ref >59)
Globulin, Total: 2.3 g/dL (ref 1.5–4.5)
Glucose: 226 mg/dL — ABNORMAL HIGH (ref 65–99)
Potassium: 4.8 mmol/L (ref 3.5–5.2)
Sodium: 142 mmol/L (ref 134–144)
Total Protein: 6.8 g/dL (ref 6.0–8.5)

## 2019-09-13 LAB — 25-HYDROXY VITAMIN D LCMS D2+D3
25-Hydroxy, Vitamin D-2: <1 ng/mL
25-Hydroxy, Vitamin D-3: 33 ng/mL
25-Hydroxy, Vitamin D: 33 ng/mL

## 2019-09-13 LAB — VITAMIN B12: Vitamin B-12: 438 pg/mL (ref 232–1245)

## 2019-09-13 LAB — SEDIMENTATION RATE: Sed Rate: 6 mm/hr (ref 0–30)

## 2019-09-13 LAB — C-REACTIVE PROTEIN: CRP: <1 mg/L (ref 0–10)

## 2019-09-13 LAB — MAGNESIUM: Magnesium: 1.7 mg/dL (ref 1.6–2.3)

## 2019-09-21 ENCOUNTER — Telehealth: Payer: Self-pay | Admitting: *Deleted

## 2019-09-21 ENCOUNTER — Other Ambulatory Visit: Payer: Self-pay

## 2019-09-21 ENCOUNTER — Ambulatory Visit
Admission: RE | Admit: 2019-09-21 | Discharge: 2019-09-21 | Disposition: A | Discharge status: Home / Self Care | Payer: Medicare HMO | Source: Ambulatory Visit | Attending: Pain Medicine | Admitting: Pain Medicine

## 2019-09-21 DIAGNOSIS — G8929 Other chronic pain: Secondary | ICD-10-CM | POA: Diagnosis not present

## 2019-09-21 DIAGNOSIS — M5030 Other cervical disc degeneration, unspecified cervical region: Secondary | ICD-10-CM | POA: Insufficient documentation

## 2019-09-21 DIAGNOSIS — M5010 Cervical disc disorder with radiculopathy, unspecified cervical region: Secondary | ICD-10-CM | POA: Diagnosis not present

## 2019-09-21 DIAGNOSIS — M542 Cervicalgia: Secondary | ICD-10-CM | POA: Diagnosis not present

## 2019-09-21 DIAGNOSIS — M5382 Other specified dorsopathies, cervical region: Secondary | ICD-10-CM | POA: Insufficient documentation

## 2019-09-21 DIAGNOSIS — M24111 Other articular cartilage disorders, right shoulder: Secondary | ICD-10-CM | POA: Insufficient documentation

## 2019-09-21 DIAGNOSIS — M5412 Radiculopathy, cervical region: Secondary | ICD-10-CM | POA: Insufficient documentation

## 2019-09-21 NOTE — Telephone Encounter (Signed)
Patient informed that Dr. Cassie Freer only gave clearance to stop Plavix 5 days. Dr. Laban Emperor does not feel it is safe to do a procedure unless Plavix is discontinued at least 7 days. Will schedule eval appt to discuss further options.

## 2019-09-22 ENCOUNTER — Telehealth: Payer: Self-pay

## 2019-09-22 NOTE — Telephone Encounter (Signed)
This was addressed yesterday. His cardiologist has denied cessation of PLAVIX. The patient has scheduled an eval appt in August to discuss what else can be done since he cannot be cleared to stop his blood thinner. Thank you for the reminder.

## 2019-09-22 NOTE — Telephone Encounter (Signed)
He had a new patient visit on July 12 and Dr. Laban Emperor ordered a SSNB but needed clearance to stop blood thinners. Did we get this clearance? His insurance doesn't require prior authorization so we can schedule when we find out about the blood thinners.

## 2019-09-27 NOTE — Progress Notes (Addendum)
PROVIDER NOTE: Information contained herein reflects review and annotations entered in association with encounter. Interpretation of such information and data should be left to medically-trained personnel. Information provided to patient can be located elsewhere in the medical record under "Patient Instructions". Document created using STT-dictation technology, any transcriptional errors that may result from process are unintentional.    Patient: Gregory Mendoza  Service Category: E/M  Provider: Gaspar Cola, MD  DOB: 1945-08-31  DOS: 09/28/2019  Specialty: Interventional Pain Management  MRN: [id number]  Setting: Ambulatory outpatient  PCP: Kirk Ruths, MD  Type: Established Patient    Referring Provider: Kirk Ruths, MD  Location: Office  Delivery: Face-to-face     Primary Reason(s) for Visit: Encounter for evaluation before starting new chronic pain management plan of care (Level of risk: moderate) CC: Neck Pain and Shoulder Injury (left )  HPI  Gregory Mendoza is a 74 y.o. year old, male patient, who comes today for a follow-up evaluation to review the test results and decide on a treatment plan. He has Groin pain, right; Chest pain; NSTEMI (non-ST elevated myocardial infarction) [facility]); STEMI involving right coronary artery ([place]); Chronic pain syndrome; Pharmacologic therapy; Disorder of skeletal system; Problems influencing health status; Chronic back pain; COPD (chronic obstructive pulmonary disease) ([place]); Peripheral neuropathy; CAD (coronary artery disease), native coronary artery; Cardiac syncope; Chronic sciatica (Right); COPD (chronic obstructive pulmonary disease) with chronic bronchitis (HCC); DM II (diabetes mellitus, type II), controlled ([place]); HTN (hypertension), benign; Nontraumatic incomplete tear of rotator cuff (Left); Rotator cuff tendinitis (Left); Rotator cuff tendinitis (Right); Tendinitis of upper biceps tendon of right shoulder; Health care maintenance; Cervical  disc disorder with radiculopathy of cervical region (Right); Cervical radiculopathy at C6 (Right); Chronic shoulder pain (2ry area of Pain) (Bilateral) (L>R); Cervicalgia; DDD (degenerative disc disease), cervical; Decreased range of motion of intervertebral discs of cervical spine; Impaired range of motion of both shoulders; Chronic neck pain (1ry area of Pain) (posterior) (Bilateral) (L>R); Chronic shoulder radicular pain (Bilateral); Chronic anticoagulation (Plavix); Cervical Grade 1 (2 mm) Anterolisthesis of C5/C6; Cervical foraminal stenosis; Cervical neural foraminal stenosis; Degenerative tear of glenoid labrum of shoulder (Right); Abnormal MRI, cervical spine (09/21/2019); Cervical facet hypertrophy (Multilevel) (C2-C7) (Bilateral); Cervical facet syndrome (Multilevel) (Bilateral); Cervical spondylosis with radiculopathy (C6) (Right); Arthropathy of shoulder (Left); Arthropathy of shoulder (Right); Arthropathy of shoulders (Bilateral); and Abnormal MRI, shoulder (Bilateral) (04/02/2018) on their problem list. His primarily concern today is the Neck Pain and Shoulder Injury (left )  Pain Assessment: Location: Left Neck (shoulder, left) Radiating: pain going into left arm Onset: More than a month ago Duration: Chronic pain Quality: Constant, Discomfort Severity: 5 /10 (subjective, self-reported pain score)  Note: Reported level is compatible with observation.                         When using our objective Pain Scale, levels between 6 and 10/10 are said to belong in an emergency room, as it progressively worsens from a 6/10, described as severely limiting, requiring emergency care not usually available at an outpatient pain management facility. At a 6/10 level, communication becomes difficult and requires great effort. Assistance to reach the emergency department may be required. Facial flushing and profuse sweating along with potentially dangerous increases in heart rate and blood pressure will be  evident. Effect on ADL: hurts more with movement.  difficult to get to sleep, hard to get comfortable. Timing: Constant Modifying factors: nothing currently BP: (!) 160/95  HR: 73  Gregory Mendoza comes in today for a follow-up visit after his initial evaluation on 09/22/2019. Today we went over the results of his tests. These were explained in "Layman's terms". During today's appointment we went over my diagnostic impression, as well as the proposed treatment plan.  According to the patient the primary area of pain is that of the neck in the posterior aspect, bilaterally, (L>R) he denied any prior surgeries, recent x-rays, nerve blocks, or physical therapy.  He admited to numbness over the right thumb and index finger following the distribution of C6.  The patient's secondary area pain is that of the shoulders, bilaterally,  (L>R).  He admited having had right shoulder surgery around February or March 2021 by Dr. Roland Rack for a rotator cuff repair.  The patient indicated having had bilateral shoulder MRIs and having had some intra-articular shoulder joint injections around December/January by Dr. Roland Rack.  He also admited to having had some physical therapy around March 2021 which he describes as having helped to a certain degree.   Provocative physical exam revealed right lower extremity pain, numbness, and weakness following the C6 distribution he also presented with bilateral decreased shoulder range of motion and pain.  Summary and recommendations: On the patient's last visit we sent a request to Dr. Josefa Half for clearance to stop his Plavix for 7 to 10 days prior to procedures.  Clearance was denied.  Dr. Josefa Half indicated that the patient is unable to come off of the Plavix for 7 days.  The risks of doing an injection in a patient that is anticoagulated are too high when it comes to epidurals and spinals.  Intraspinal bleeding may lead to epidural and/or spinal hematoma that could compress his spinal  cord leading to permanent paralysis.  These risks were discussed with the patient who understood and agreed to stay away from these interventional therapies.  After careful review of his case, it is my professional opinion that he does have the necessary indications for the use of opioid analgesics as a treatment of his chronic pain.  However, since I do not take patients strictly for medication management I will limit my involvement to providing recommendations.  Based on today's evaluation, the patient's kidney function seems to be good and therefore he should tolerate higher doses of gabapentin.  The maximum recommended for the treatment of pain is 3600 mg/day, if tolerated by the patient.  Typically patients take this in the form of 900 mg p.o. 4 times daily.  Currently he takes 600 mg p.o. 3 times daily.  He states that in the past he used to take higher doses, but this was decreased.  At this point I believe that he could easily tolerate increasing this dose.  I recommend increasing the gabapentin dose by no more than 300 mg every 2 weeks.  This will allow the patient to get used to the increase, before any further increases are instituted.  Because he is anticoagulated, the use of any nonsteroidal anti-inflammatory drugs is relatively contraindicated. According to the PMP, he has taken tramadol 50 mg and hydrocodone/APAP 5/325 as well as 7.5/325 pills.  He has been able to tolerate those in the past.  However, because of his cardiovascular problems, I would suggest staying away from the tramadol and perhaps considering something without acetaminophen so that this would not stress his liver in the long run.  I would suggest a trial of oxycodone IR 5 mg 1 tablet p.o. 3 times daily.  The main thing here  would be to make sure that when he develops tolerance, he does not go into the roller coaster of increasing doses or switching medications around.  Instead use  "Drug Holidays".  The correct way of handling  tolerance should be to taper the medication down and to stop it for 2 consecutive weeks (making sure that is not replaced by any other opioid), after which he can go back on the medication.  This should clear the excess opioid receptors and allow for the medication to reset and again work as if he was an opioid nave patient.  Controlled Substance Pharmacotherapy Assessment REMS (Risk Evaluation and Mitigation Strategy)  Analgesic: None Highest recorded MME/day: 40 mg/day MME/day: 0 mg/day  Pill Count: None expected due to no prior prescriptions written by our practice. Janett Billow, RN  09/28/2019  8:09 AM  Sign when Signing Visit Safety precautions to be maintained throughout the outpatient stay will include: orient to surroundings, keep bed in low position, maintain call bell within reach at all times, provide assistance with transfer out of bed and ambulation.   Monitoring: [place] PMP: PDMP reviewed during this encounter. Online review of the past 63-monthperiod previously conducted. Not applicable at this point since we have not taken over the patient's medication management yet. List of other Serum/Urine Drug Screening Test(s):  No results found. List of all UDS test(s) done:  Lab Results  Component Value Date   SUMMARY Note 09/05/2019   Last UDS on record: Summary  Date Value Ref Range Status  09/05/2019 Note  Final    Comment:    ==================================================================== Compliance Drug Analysis, Ur ==================================================================== Test                             Result       Flag       Units  Drug Present and Declared for Prescription Verification   Gabapentin                     PRESENT      EXPECTED   Acetaminophen                  PRESENT      EXPECTED   Metoprolol                     PRESENT      EXPECTED  Drug Present not Declared for Prescription Verification   Diphenhydramine                PRESENT       UNEXPECTED  Drug Absent but Declared for Prescription Verification   Hydrocodone                    Not Detected UNEXPECTED ng/mg creat   Salicylate                     Not Detected UNEXPECTED    Aspirin, as indicated in the declared medication list, is not always    detected even when used as directed.  ==================================================================== Test                      Result    Flag   Units      Ref Range   Creatinine              109  mg/dL      >=20 ==================================================================== Declared Medications:  The flagging and interpretation on this report are based on the  following declared medications.  Unexpected results may arise from  inaccuracies in the declared medications.   **Note: The testing scope of this panel includes these medications:   Gabapentin (Neurontin)  Hydrocodone (Norco)  Metoprolol (Lopressor)   **Note: The testing scope of this panel does not include small to  moderate amounts of these reported medications:   Acetaminophen (Tylenol)  Acetaminophen (Norco)  Aspirin   **Note: The testing scope of this panel does not include the  following reported medications:   Atorvastatin (Lipitor)  Clopidogrel (Plavix)  Glimepiride (Amaryl)  Insulin  Isosorbide (Imdur)  Lisinopril (Zestril)  Metformin (Glucophage)  Multivitamin  Nitroglycerin (Nitrostat)  Pioglitazone (Actos) ==================================================================== For clinical consultation, please call [phone]. ====================================================================    UDS interpretation: No unexpected findings.          Medication Assessment Form: Not applicable. Treatment compliance: Not applicable Risk Assessment Profile: Aberrant behavior: See initial evaluations. None observed or detected today Comorbid factors increasing risk of overdose: See initial evaluation. No  additional risks detected today Opioid risk tool (ORT):  Opioid Risk  09/05/2019  Alcohol 0  Illegal Drugs 0  Rx Drugs 0  Alcohol 0  Illegal Drugs 0  Rx Drugs 0  Age between 16-45 years  0  History of Preadolescent Sexual Abuse 0  Psychological Disease 0  Depression 0  Opioid Risk Tool Scoring 0  Opioid Risk Interpretation Low Risk    ORT Scoring interpretation table:  Score <3 = Low Risk for SUD  Score between 4-7 = Moderate Risk for SUD  Score >8 = High Risk for Opioid Abuse   Risk of substance use disorder (SUD): Low  Risk Mitigation Strategies:  Patient opioid safety counseling: Not applicable. Patient-Prescriber Agreement (PPA): No agreement signed.  Controlled substance notification to other providers: Not applicable  Pharmacologic Plan: No opioid analgesic prescribed.             Laboratory Chemistry Profile   Renal Lab Results  Component Value Date   BUN 14 09/05/2019   CREATININE 0.90 09/05/2019   BCR 16 09/05/2019   GFRAA 97 09/05/2019   GFRNONAA 84 09/05/2019     Electrolytes Lab Results  Component Value Date   NA 142 09/05/2019   K 4.8 09/05/2019   CL 105 09/05/2019   CALCIUM 9.5 09/05/2019   MG 1.7 09/05/2019     Hepatic Lab Results  Component Value Date   AST 17 09/05/2019   ALBUMIN 4.5 09/05/2019   ALKPHOS 57 09/05/2019     ID Lab Results  Component Value Date   SARSCOV2NAA NEGATIVE 04/15/2019   MRSAPCR NEGATIVE 12/18/2017     Bone Lab Results  Component Value Date   25OHVITD1 33 09/05/2019   25OHVITD2 <1.0 09/05/2019   25OHVITD3 33 09/05/2019     Endocrine Lab Results  Component Value Date   GLUCOSE 226 (H) 09/05/2019     Neuropathy Lab Results  Component Value Date   VITAMINB12 438 09/05/2019     CNS No results found for: COLORCSF, APPEARCSF, RBCCOUNTCSF, WBCCSF, POLYSCSF, LYMPHSCSF, EOSCSF, PROTEINCSF, GLUCCSF, JCVIRUS, CSFOLI, IGGCSF, LABACHR, ACETBL, LABACHR, ACETBL   Inflammation (CRP: Acute  ESR:  Chronic) Lab Results  Component Value Date   CRP <1 09/05/2019   ESRSEDRATE 6 09/05/2019     Rheumatology No results found for: RF, ANA, LABURIC, URICUR, LYMEIGGIGMAB, LYMEABIGMQN, HLAB27   Coagulation Lab Results  Component Value Date   INR 1.0 09/05/2019   LABPROT 10.3 09/05/2019   APTT 30 09/05/2019   PLT 329 09/05/2019     Cardiovascular Lab Results  Component Value Date   CKMB 2.6 12/17/2017   TROPONINI 9.89 (HH) 12/18/2017   HGB 15.2 03/15/2019   HCT 47.9 03/15/2019     Screening Lab Results  Component Value Date   SARSCOV2NAA NEGATIVE 04/15/2019   MRSAPCR NEGATIVE 12/18/2017     Cancer No results found for: CEA, CA125, LABCA2   Allergens No results found for: ALMOND, APPLE, ASPARAGUS, AVOCADO, BANANA, BARLEY, BASIL, BAYLEAF, GREENBEAN, LIMABEAN, WHITEBEAN, BEEFIGE, REDBEET, BLUEBERRY, BROCCOLI, CABBAGE, MELON, CARROT, CASEIN, CASHEWNUT, CAULIFLOWER, CELERY     Note: Lab results reviewed.  Recent Diagnostic Imaging Review  Cervical Imaging: Cervical MR wo contrast: Results for orders placed during the hospital encounter of 09/21/19 MR CERVICAL SPINE WO CONTRAST  Narrative CLINICAL DATA:  Neck pain  EXAM: MRI CERVICAL SPINE WITHOUT CONTRAST  TECHNIQUE: Multiplanar, multisequence MR imaging of the cervical spine was performed. No intravenous contrast was administered.  COMPARISON:  09/05/2019 cervical spine radiographs.  FINDINGS: Alignment: Minimal grade 1 C5-6 and C6-7 anterolisthesis. Exaggerated lordosis.  Vertebrae: Normal bone marrow signal intensity. Scattered T1/T2 hyperintense foci may reflect hemangiomata versus focal fat.  Cord: Normal signal and morphology.  Posterior Fossa, vertebral arteries: Negative.  Disc levels: Multilevel desiccation. Mild disc space loss at the C4-5 and C5-6 levels.  C2-3: Small central protrusion and bilateral facet hypertrophy. No significant spinal canal or neural foraminal narrowing.  C3-4: Disc  osteophyte complex with superimposed central protrusion, uncovertebral and bilateral facet hypertrophy. Moderate spinal canal, mild right and moderate left neural foraminal narrowing.  C4-5: Bilateral uncovertebral and facet hypertrophy. Mild spinal canal and bilateral neural foraminal narrowing.  C5-6: Disc osteophyte complex with small superimposed central protrusion, uncovertebral and bilateral facet hypertrophy. Moderate spinal canal and mild bilateral neural foraminal narrowing.  C6-7: Disc osteophyte complex with uncovertebral and bilateral facet hypertrophy.No significant spinal canal narrowing. Mild bilateral neural foraminal narrowing.  C7-T1: No significant disc bulge, spinal canal or neural foraminal narrowing.  Paraspinal tissues: Within normal limits.  IMPRESSION: Multilevel spondylosis with minimal grade 1 C5-6 and C6-7 anterolisthesis.  Moderate C3-4 and C5-6 spinal canal narrowing.  Moderate left C3-4 neural foraminal narrowing.    Electronically Signed By: Primitivo Gauze M.D. On: 09/21/2019 14:01  Cervical DG Bending/F/E views: Results for orders placed during the hospital encounter of 09/05/19 DG Cervical Spine With Flex & Extend  Narrative CLINICAL DATA:  Cervicalgia.  Chronic neck pain.  EXAM: CERVICAL SPINE COMPLETE WITH FLEXION AND EXTENSION VIEWS  COMPARISON:  None.  FINDINGS: The prevertebral soft tissues are normal. In the neutral position, there is 2 mm of anterolisthesis at C5-6. Lateral flexion and extension views demonstrate no dynamic instability, but mildly limited range of motion. There is multilevel spondylosis with disc space narrowing, uncinate spurring and facet disease, greatest at C4-5 and C5-6. Oblique views demonstrate mild to moderate foraminal narrowing, worst on the right at C4-5 and on the left at C3-4 and C4-5. No acute osseous findings are seen.  IMPRESSION: 1. Multilevel spondylosis with resulting osseous  foraminal narrowing. No acute osseous findings or evidence of dynamic instability. 2. 2 mm anterolisthesis at C5-6.   Electronically Signed By: Richardean Sale M.D. On: 09/05/2019 15:18  Shoulder Imaging: Shoulder-R MR wo contrast: Results for orders placed during the hospital encounter of 04/02/18 MR SHOULDER RIGHT WO CONTRAST  Narrative CLINICAL DATA:  Chronic shoulder  pain and limited range of motion.  EXAM: MRI OF THE RIGHT SHOULDER WITHOUT CONTRAST  TECHNIQUE: Multiplanar, multisequence MR imaging of the shoulder was performed. No intravenous contrast was administered.  COMPARISON:  None.  FINDINGS: Rotator cuff: Moderate supraspinatus tendinosis with bursal and articular surface fraying. Mild infraspinatus and subscapularis tendinosis. The teres minor tendon is unremarkable.  Muscles: No atrophy or abnormal signal of the muscles of the rotator cuff.  Biceps long head: Moderate intra-articular tendinosis. Intact and normally positioned.  Acromioclavicular Joint: Moderate arthropathy of the acromioclavicular joint. Type I acromion. Small subacromial/subdeltoid bursal fluid.  Glenohumeral Joint: No joint effusion. Mild glenohumeral degenerative changes.  Labrum: Grossly intact, but evaluation is limited by lack of intraarticular fluid.  Bones:  No acute fracture or dislocation.  No focal bone lesion.  Other: None.  IMPRESSION: 1. Moderate supraspinatus tendinosis with bursal and articular surface fraying. 2. Mild infraspinatus and subscapularis tendinosis. 3. Moderate intra-articular biceps tendinosis. 4. Mild glenohumeral and moderate acromioclavicular osteoarthritis.   Electronically Signed By: Titus Dubin M.D. On: 04/02/2018 11:54  Shoulder-L MR wo contrast: Results for orders placed during the hospital encounter of 04/02/18 MR SHOULDER LEFT WO CONTRAST  Narrative CLINICAL DATA:  Chronic shoulder pain and limited range of motion for the  past few months. No injury.  EXAM: MRI OF THE LEFT SHOULDER WITHOUT CONTRAST  TECHNIQUE: Multiplanar, multisequence MR imaging of the shoulder was performed. No intravenous contrast was administered.  COMPARISON:  MRI left shoulder dated March 20, 2008.  FINDINGS: Rotator cuff: Progressive moderate supraspinatus tendinosis with prominent irregularity and probable high-grade partial-thickness bursal surface tearing of the posterior tendon. Progressive moderate infraspinatus and subscapularis tendinosis. The teres minor tendon is unremarkable.  Muscles: No atrophy or abnormal signal of the muscles of the rotator cuff.  Biceps long head: Progressive moderate intra-articular tendinosis. Intact and normally positioned.  Acromioclavicular Joint: Moderate arthropathy of the acromioclavicular joint. Type I acromion. No significant subacromial/subdeltoid bursal fluid.  Glenohumeral Joint: No joint effusion. Mild glenohumeral degenerative changes.  Labrum: Grossly intact, but evaluation is limited by lack of intraarticular fluid.  Bones:  No acute fracture or dislocation.  No focal bone lesion.  Other: None.  IMPRESSION: 1. Progressive moderate supraspinatus tendinosis with prominent irregularity and new probable high-grade partial-thickness bursal surface tearing of the posterior tendon. 2. Progressive moderate infraspinatus and subscapularis tendinosis. 3. Progressive moderate intra-articular biceps tendinosis. 4. Progressive mild glenohumeral and moderate acromioclavicular osteoarthritis.   Electronically Signed By: Titus Dubin M.D. On: 04/02/2018 11:37  Ankle Imaging: Ankle-R DG Complete: Results for orders placed during the hospital encounter of 10/17/16 DG Ankle Complete Right  Narrative CLINICAL DATA:  Fall from ladder.  Ankle and foot pain.  EXAM: RIGHT ANKLE - COMPLETE 3+ VIEW  COMPARISON:  None.  FINDINGS: Three views study shows an acute tiny  cortical avulsion injury at the medial malleolus. Chronic avulsion fragments identified adjacent to the lateral malleolus. Ankle mortise is preserved. No subluxation or dislocation. Soft tissue swelling is evident.  IMPRESSION: Acute tiny cortical avulsion injury at the medial malleolus.   Electronically Signed By: Misty Stanley M.D. On: 10/17/2016 18:29  Foot Imaging: Foot-R DG Complete: Results for orders placed during the hospital encounter of 10/17/16 DG Foot Complete Right  Narrative CLINICAL DATA:  Patient fell from ladder.  Foot and ankle pain.  EXAM: RIGHT FOOT COMPLETE - 3+ VIEW  COMPARISON:  None.  FINDINGS: There is no evidence of fracture or dislocation. There is no evidence of arthropathy or other focal  bone abnormality. Soft tissues are unremarkable.  IMPRESSION: Negative.   Electronically Signed By: Misty Stanley M.D. On: 10/17/2016 18:28  Complexity Note: Imaging results reviewed. Results shared with Mr. Banko, using [employer].                        Meds   Current Outpatient Medications:  .  acetaminophen (TYLENOL) 500 MG tablet, Take 500-1,000 mg by mouth every 6 (six) hours as needed for pain., Disp: , Rfl:  .  aspirin EC 81 MG tablet, Take 81 mg by mouth daily at 6 PM., Disp: , Rfl:  .  atorvastatin (LIPITOR) 10 MG tablet, Take 10 mg by mouth daily at 6 PM. , Disp: , Rfl:  .  clopidogrel (PLAVIX) 75 MG tablet, Take 75 mg by mouth daily., Disp: , Rfl:  .  gabapentin (NEURONTIN) 600 MG tablet, Take 600 mg by mouth 3 (three) times daily. , Disp: , Rfl:  .  glimepiride (AMARYL) 4 MG tablet, Take 4 mg by mouth daily. , Disp: , Rfl:  .  insulin NPH Human (HUMULIN N,NOVOLIN N) 100 UNIT/ML injection, Inject 10 Units into the skin 2 (two) times daily. , Disp: , Rfl:  .  isosorbide mononitrate (IMDUR) 30 MG 24 hr tablet, Take 30 mg by mouth daily at 6 PM. , Disp: , Rfl:  .  lisinopril (PRINIVIL,ZESTRIL) 10 MG tablet, Take 10 mg by mouth daily. ,  Disp: , Rfl:  .  metFORMIN (GLUCOPHAGE) 1000 MG tablet, Take 1,000 mg by mouth 2 (two) times daily with a meal. , Disp: , Rfl:  .  metoprolol (LOPRESSOR) 50 MG tablet, Take 50 mg by mouth 2 (two) times daily. , Disp: , Rfl:  .  Multiple Vitamin (MULTIVITAMIN WITH MINERALS) TABS tablet, Take 1 tablet by mouth daily., Disp: , Rfl:  .  nitroGLYCERIN (NITROSTAT) 0.3 MG SL tablet, Place 3 mg under the tongue daily as needed., Disp: , Rfl:  .  pioglitazone (ACTOS) 15 MG tablet, Take 15 mg by mouth at bedtime. , Disp: , Rfl:   ROS  Constitutional: Denies any fever or chills Gastrointestinal: No reported hemesis, hematochezia, vomiting, or acute GI distress Musculoskeletal: Denies any acute onset joint swelling, redness, loss of ROM, or weakness Neurological: No reported episodes of acute onset apraxia, aphasia, dysarthria, agnosia, amnesia, paralysis, loss of coordination, or loss of consciousness  Allergies  Mr. Blyth has No Known Allergies.  [place]  Drug: Mr. Allender  reports no history of drug use. Alcohol:  reports no history of alcohol use. Tobacco:  reports that he quit smoking about 20 years ago. He quit after 40.00 years of use. He has never used smokeless tobacco. Medical:  has a past medical history of Arthritis, Chronic back pain, Diabetes mellitus without complication ([place]), Dyspnea, Dysrhythmia, Hyperlipidemia, Hypertension, Myocardial infarction ([place]) (2007, 2009), and Peripheral neuropathy. Surgical: Mr. Roots  has a past surgical history that includes Colonoscopy (2017); Appendectomy (1970's); LEFT HEART CATH AND CORONARY ANGIOGRAPHY (N/A, 12/14/2017); CORONARY STENT INTERVENTION (N/A, 12/14/2017); Coronary/Graft Acute MI Revascularization (N/A, 12/18/2017); LEFT HEART CATH AND CORONARY ANGIOGRAPHY (N/A, 12/18/2017); Back surgery (1997); Cardiac catheterization; Hernia repair (Left, 02/08/2008); Inguinal hernia repair (Right, 1999); trigger thumb (Right); and Shoulder arthroscopy with  rotator cuff repair and open biceps tenodesis (Right, 04/19/2019). Family: family history is not on file.  Constitutional Exam  General appearance: Well nourished, well developed, and well hydrated. In no apparent acute distress Vitals:   09/28/19 0810  BP: Marland Kitchen)  160/95  Pulse: 73  Resp: 16  Temp: 98.7 F (37.1 C)  TempSrc: Oral  SpO2: 100%  Weight: 155 lb (70.3 kg)  Height: 5' 6"  (1.676 m)   BMI Assessment: Estimated body mass index is 25.02 kg/m as calculated from the following:   Height as of this encounter: 5' 6"  (1.676 m).   Weight as of this encounter: 155 lb (70.3 kg).  BMI interpretation table: BMI level Category Range association with higher incidence of chronic pain  <18 kg/m2 Underweight   18.5-24.9 kg/m2 Ideal body weight   25-29.9 kg/m2 Overweight Increased incidence by 20%  30-34.9 kg/m2 Obese (Class I) Increased incidence by 68%  35-39.9 kg/m2 Severe obesity (Class II) Increased incidence by 136%  >40 kg/m2 Extreme obesity (Class III) Increased incidence by 254%   Patient's current BMI Ideal Body weight  Body mass index is 25.02 kg/m. Ideal body weight: 63.8 kg (140 lb 10.5 oz) Adjusted ideal body weight: 66.4 kg (146 lb 6.3 oz)   BMI Readings from Last 4 Encounters:  09/28/19 25.02 kg/m  09/05/19 25.02 kg/m  04/19/19 25.82 kg/m  05/10/18 26.63 kg/m   Wt Readings from Last 4 Encounters:  09/28/19 155 lb (70.3 kg)  09/05/19 155 lb (70.3 kg)  04/19/19 160 lb (72.6 kg)  05/10/18 165 lb (74.8 kg)   Assessment & Plan  Primary Diagnosis & Pertinent Problem List: The primary encounter diagnosis was Chronic pain syndrome. Diagnoses of Chronic neck pain (1ry area of Pain) (posterior) (Bilateral) (L>R), DDD (degenerative disc disease), cervical, Cervical disc disorder with radiculopathy of cervical region (Right), Cervical Grade 1 (2 mm) Anterolisthesis of C5/C6, Cervical radiculopathy at C6 (Right), Cervical spondylosis with radiculopathy (C6) (Right), and  Chronic anticoagulation (Plavix) were also pertinent to this visit.  Visit Diagnosis: 1. Chronic pain syndrome   2. Chronic neck pain (1ry area of Pain) (posterior) (Bilateral) (L>R)   3. DDD (degenerative disc disease), cervical   4. Cervical disc disorder with radiculopathy of cervical region (Right)   5. Cervical Grade 1 (2 mm) Anterolisthesis of C5/C6   6. Cervical radiculopathy at C6 (Right)   7. Cervical spondylosis with radiculopathy (C6) (Right)   8. Chronic anticoagulation (Plavix)    Problems updated and reviewed during this visit: No problems updated.  Plan of Care  Pharmacotherapy (Medications Ordered): No orders of the defined types were placed in this encounter.  Procedure Orders    No procedure(s) ordered today   Lab Orders  No laboratory test(s) ordered today   Imaging Orders  No imaging studies ordered today   Referral Orders  No referral(s) requested today    Pharmacological management options:  Opioid Analgesics: I will not be prescribing any opioids at this time Membrane stabilizer: We have discussed the possibility of optimizing this mode of therapy, if tolerated Muscle relaxant: I will not be prescribing any at this time NSAID: Medically contraindicated Other analgesic(s): None prescribed at this time    Interventional management options: Recommendations:    See the above detailed recommendations under the HPI.  The patient did not get clearance to stop blood thinners.  Clearance denied.  Unless he is able to safely come off of the blood thinner, we will not be scheduling him for any interventional therapies.  Please see the above recommendations.   Considering:   NOTE: PLAVIX ANTICOAGULATION- NO CLEARANCE TO STOP. HIGH RISK.  None at this time   PRN Procedures:   None at this time    Provider-requested follow-up: Return if symptoms  worsen or fail to improve. Recent Visits Date Type Provider Dept  09/28/19 Office Visit Milinda Pointer, MD  [facility]  09/05/19 Office Visit Milinda Pointer, MD [facility]  Showing recent visits within past 90 days and meeting all other requirements Future Appointments No visits were found meeting these conditions. Showing future appointments within next 90 days and meeting all other requirements  Primary Care Physician: Kirk Ruths, MD Note by: Gaspar Cola, MD Date: 09/28/2019; Time: 2:13 PM

## 2019-09-28 ENCOUNTER — Ambulatory Visit: Payer: Medicare HMO | Attending: Pain Medicine | Admitting: Pain Medicine

## 2019-09-28 ENCOUNTER — Other Ambulatory Visit: Payer: Self-pay

## 2019-09-28 ENCOUNTER — Encounter: Payer: Self-pay | Admitting: Pain Medicine

## 2019-09-28 VITALS — BP 160/95 | HR 73 | Temp 98.7°F | Resp 16 | Ht 66.0 in | Wt 155.0 lb

## 2019-09-28 DIAGNOSIS — M4722 Other spondylosis with radiculopathy, cervical region: Secondary | ICD-10-CM | POA: Diagnosis not present

## 2019-09-28 DIAGNOSIS — M47812 Spondylosis without myelopathy or radiculopathy, cervical region: Secondary | ICD-10-CM | POA: Insufficient documentation

## 2019-09-28 DIAGNOSIS — M5010 Cervical disc disorder with radiculopathy, unspecified cervical region: Secondary | ICD-10-CM | POA: Diagnosis not present

## 2019-09-28 DIAGNOSIS — G894 Chronic pain syndrome: Secondary | ICD-10-CM

## 2019-09-28 DIAGNOSIS — M5030 Other cervical disc degeneration, unspecified cervical region: Secondary | ICD-10-CM | POA: Diagnosis not present

## 2019-09-28 DIAGNOSIS — R937 Abnormal findings on diagnostic imaging of other parts of musculoskeletal system: Secondary | ICD-10-CM | POA: Insufficient documentation

## 2019-09-28 DIAGNOSIS — M19012 Primary osteoarthritis, left shoulder: Secondary | ICD-10-CM | POA: Insufficient documentation

## 2019-09-28 DIAGNOSIS — Z7901 Long term (current) use of anticoagulants: Secondary | ICD-10-CM | POA: Diagnosis not present

## 2019-09-28 DIAGNOSIS — M4310 Spondylolisthesis, site unspecified: Secondary | ICD-10-CM | POA: Insufficient documentation

## 2019-09-28 DIAGNOSIS — R936 Abnormal findings on diagnostic imaging of limbs: Secondary | ICD-10-CM | POA: Insufficient documentation

## 2019-09-28 DIAGNOSIS — M5412 Radiculopathy, cervical region: Secondary | ICD-10-CM

## 2019-09-28 DIAGNOSIS — G8929 Other chronic pain: Secondary | ICD-10-CM | POA: Insufficient documentation

## 2019-09-28 DIAGNOSIS — M542 Cervicalgia: Secondary | ICD-10-CM | POA: Insufficient documentation

## 2019-09-28 DIAGNOSIS — M19011 Primary osteoarthritis, right shoulder: Secondary | ICD-10-CM | POA: Insufficient documentation

## 2019-09-28 NOTE — Patient Instructions (Signed)
____________________________________________________________________________________________  Drug Holidays (Slow)  What is a "Drug Holiday"? Drug Holiday: is the name given to the period of time during which a patient stops taking a medication(s) for the purpose of eliminating tolerance to the drug.  Benefits . Improved effectiveness of opioids. . Decreased opioid dose needed to achieve benefits. . Improved pain with lesser dose.  What is tolerance? Tolerance: is the progressive decreased in effectiveness of a drug due to its repetitive use. With repetitive use, the body gets use to the medication and as a consequence, it loses its effectiveness. This is a common problem seen with opioid pain medications. As a result, a larger dose of the drug is needed to achieve the same effect that used to be obtained with a smaller dose.  How long should a "Drug Holiday" last? You should stay off of the pain medicine for at least 14 consecutive days. (2 weeks)  Should I stop the medicine "cold [place]"? No. You should always coordinate with your Pain Specialist so that he/she can provide you with the correct medication dose to make the transition as smoothly as possible.  How do I stop the medicine? Slowly. You will be instructed to decrease the daily amount of pills that you take by one (1) pill every seven (7) days. This is called a "slow downward taper" of your dose. For example: if you normally take four (4) pills per day, you will be asked to drop this dose to three (3) pills per day for seven (7) days, then to two (2) pills per day for seven (7) days, then to one (1) per day for seven (7) days, and at the end of those last seven (7) days, this is when the "Drug Holiday" would start.   Will I have withdrawals? By doing a "slow downward taper" like this one, it is unlikely that you will experience any significant withdrawal symptoms. Typically, what triggers withdrawals is the sudden stop of a high  dose opioid therapy. Withdrawals can usually be avoided by slowly decreasing the dose over a prolonged period of time. If you do not follow these instructions and decide to stop your medication abruptly, withdrawals may be possible.  What are withdrawals? Withdrawals: refers to the wide range of symptoms that occur after stopping or dramatically reducing opiate drugs after heavy and prolonged use. Withdrawal symptoms do not occur to patients that use low dose opioids, or those who take the medication sporadically. Contrary to benzodiazepine (example: Valium, Xanax, etc.) or alcohol withdrawals ("Delirium Tremens"), opioid withdrawals are not lethal. Withdrawals are the physical manifestation of the body getting rid of the excess receptors.  Expected Symptoms Early symptoms of withdrawal may include: . Agitation . Anxiety . Muscle aches . Increased tearing . Insomnia . Runny nose . Sweating . Yawning  Late symptoms of withdrawal may include: . Abdominal cramping . Diarrhea . Dilated pupils . Goose bumps . Nausea . Vomiting  Will I experience withdrawals? Due to the slow nature of the taper, it is very unlikely that you will experience any.  What is a slow taper? Taper: refers to the gradual decrease in dose.  (Last update: 09/14/2019) ____________________________________________________________________________________________    ____________________________________________________________________________________________  CANNABIDIOL (AKA: CBD Oil or Pills)  Applies to: All patients receiving prescriptions of controlled substances (written and/or electronic).  General Information: Cannabidiol (CBD), a derivative of Marijuana, was discovered in 85. It is one of some 113 identified cannabinoids in cannabis (Marijuana) plants, accounting for up to 40% of the plant's extract.  As of 2018, preliminary clinical research on cannabidiol included studies of anxiety, cognition, movement  disorders, and pain.  Cannabidiol is consummed in multiple ways, including inhalation of cannabis smoke or vapor, as an aerosol spray into the cheek, and by mouth. It may be supplied as CBD oil containing CBD as the active ingredient (no added tetrahydrocannabinol (THC) or terpenes), a full-plant CBD-dominant hemp extract oil, capsules, dried cannabis, or as a liquid solution. CBD is thought not have the same psychoactivity as THC, and may affect the actions of THC. Studies suggest that CBD may interact with different biological targets, including cannabinoid receptors and other neurotransmitter receptors. As of 2018 the mechanism of action for its biological effects has not been determined.  In the [place], cannabidiol has a limited approval by the Food and Drug Administration (FDA) for treatment of only two types of epilepsy disorders. The side effects of long-term use of the drug include somnolence, decreased appetite, diarrhea, fatigue, malaise, weakness, sleeping problems, and others.  CBD remains a Schedule I drug prohibited for any use.  Legality: Some manufacturers ship CBD products nationally, an illegal action which the FDA has not enforced in 2018, with CBD remaining the subject of an FDA investigational new drug evaluation, and is not considered legal as a dietary supplement or food ingredient as of December 2018. Federal illegality has made it difficult historically to conduct research on CBD. CBD is openly sold in head shops and health food stores in some states where such sales have not been explicitly legalized.  Warning: Because it is not FDA approved for general use or treatment of pain, it is not required to undergo the same manufacturing controls as prescription drugs.  This means that the available cannabidiol (CBD) may be contaminated with THC.  If this is the case, it will trigger a positive urine drug screen (UDS) test for cannabinoids (Marijuana).  Because a positive UDS for  illicit substances is a violation of our medication agreement, your opioid analgesics (pain medicine) may be permanently discontinued. (Last update: 09/14/2019) ____________________________________________________________________________________________

## 2019-09-28 NOTE — Progress Notes (Signed)
Safety precautions to be maintained throughout the outpatient stay will include: orient to surroundings, keep bed in low position, maintain call bell within reach at all times, provide assistance with transfer out of bed and ambulation.  

## 2019-10-10 DIAGNOSIS — M5412 Radiculopathy, cervical region: Secondary | ICD-10-CM | POA: Diagnosis not present

## 2019-10-10 DIAGNOSIS — I2510 Atherosclerotic heart disease of native coronary artery without angina pectoris: Secondary | ICD-10-CM | POA: Diagnosis not present

## 2019-10-13 DIAGNOSIS — M47812 Spondylosis without myelopathy or radiculopathy, cervical region: Secondary | ICD-10-CM | POA: Diagnosis not present

## 2019-10-13 DIAGNOSIS — Z79899 Other long term (current) drug therapy: Secondary | ICD-10-CM | POA: Diagnosis not present

## 2019-10-13 DIAGNOSIS — M4802 Spinal stenosis, cervical region: Secondary | ICD-10-CM | POA: Diagnosis not present

## 2019-10-13 DIAGNOSIS — M5030 Other cervical disc degeneration, unspecified cervical region: Secondary | ICD-10-CM | POA: Diagnosis not present

## 2019-10-26 DIAGNOSIS — M4802 Spinal stenosis, cervical region: Secondary | ICD-10-CM | POA: Diagnosis not present

## 2019-10-26 DIAGNOSIS — M5412 Radiculopathy, cervical region: Secondary | ICD-10-CM | POA: Diagnosis not present

## 2019-10-26 DIAGNOSIS — M5030 Other cervical disc degeneration, unspecified cervical region: Secondary | ICD-10-CM | POA: Diagnosis not present

## 2019-11-21 DIAGNOSIS — Z794 Long term (current) use of insulin: Secondary | ICD-10-CM | POA: Diagnosis not present

## 2019-11-21 DIAGNOSIS — I2510 Atherosclerotic heart disease of native coronary artery without angina pectoris: Secondary | ICD-10-CM | POA: Diagnosis not present

## 2019-11-21 DIAGNOSIS — E118 Type 2 diabetes mellitus with unspecified complications: Secondary | ICD-10-CM | POA: Diagnosis not present

## 2019-11-21 DIAGNOSIS — I10 Essential (primary) hypertension: Secondary | ICD-10-CM | POA: Diagnosis not present

## 2019-11-24 DIAGNOSIS — M5412 Radiculopathy, cervical region: Secondary | ICD-10-CM | POA: Diagnosis not present

## 2019-11-24 DIAGNOSIS — M5030 Other cervical disc degeneration, unspecified cervical region: Secondary | ICD-10-CM | POA: Diagnosis not present

## 2019-11-30 DIAGNOSIS — J449 Chronic obstructive pulmonary disease, unspecified: Secondary | ICD-10-CM | POA: Diagnosis not present

## 2019-11-30 DIAGNOSIS — I2510 Atherosclerotic heart disease of native coronary artery without angina pectoris: Secondary | ICD-10-CM | POA: Diagnosis not present

## 2019-11-30 DIAGNOSIS — E118 Type 2 diabetes mellitus with unspecified complications: Secondary | ICD-10-CM | POA: Diagnosis not present

## 2019-11-30 DIAGNOSIS — Z794 Long term (current) use of insulin: Secondary | ICD-10-CM | POA: Diagnosis not present

## 2019-11-30 DIAGNOSIS — I10 Essential (primary) hypertension: Secondary | ICD-10-CM | POA: Diagnosis not present

## 2019-12-16 DIAGNOSIS — M47812 Spondylosis without myelopathy or radiculopathy, cervical region: Secondary | ICD-10-CM | POA: Diagnosis not present

## 2019-12-16 DIAGNOSIS — M5412 Radiculopathy, cervical region: Secondary | ICD-10-CM | POA: Diagnosis not present

## 2019-12-16 DIAGNOSIS — M4802 Spinal stenosis, cervical region: Secondary | ICD-10-CM | POA: Diagnosis not present

## 2019-12-16 DIAGNOSIS — M5030 Other cervical disc degeneration, unspecified cervical region: Secondary | ICD-10-CM | POA: Diagnosis not present

## 2020-01-03 DIAGNOSIS — M47812 Spondylosis without myelopathy or radiculopathy, cervical region: Secondary | ICD-10-CM | POA: Diagnosis not present

## 2020-01-05 DIAGNOSIS — M5412 Radiculopathy, cervical region: Secondary | ICD-10-CM | POA: Diagnosis not present

## 2020-01-05 DIAGNOSIS — M5030 Other cervical disc degeneration, unspecified cervical region: Secondary | ICD-10-CM | POA: Diagnosis not present

## 2020-01-05 DIAGNOSIS — M4802 Spinal stenosis, cervical region: Secondary | ICD-10-CM | POA: Diagnosis not present

## 2020-01-05 DIAGNOSIS — Z79899 Other long term (current) drug therapy: Secondary | ICD-10-CM | POA: Diagnosis not present

## 2020-01-05 DIAGNOSIS — M47812 Spondylosis without myelopathy or radiculopathy, cervical region: Secondary | ICD-10-CM | POA: Diagnosis not present

## 2020-01-25 IMAGING — CR DG CHEST 2V
2 series · 2 of 2 positions shown · non-contrast
Comparison: 10/27/2006

CLINICAL DATA: Chest pain

EXAM:
CHEST - 2 VIEW

[chest pa]
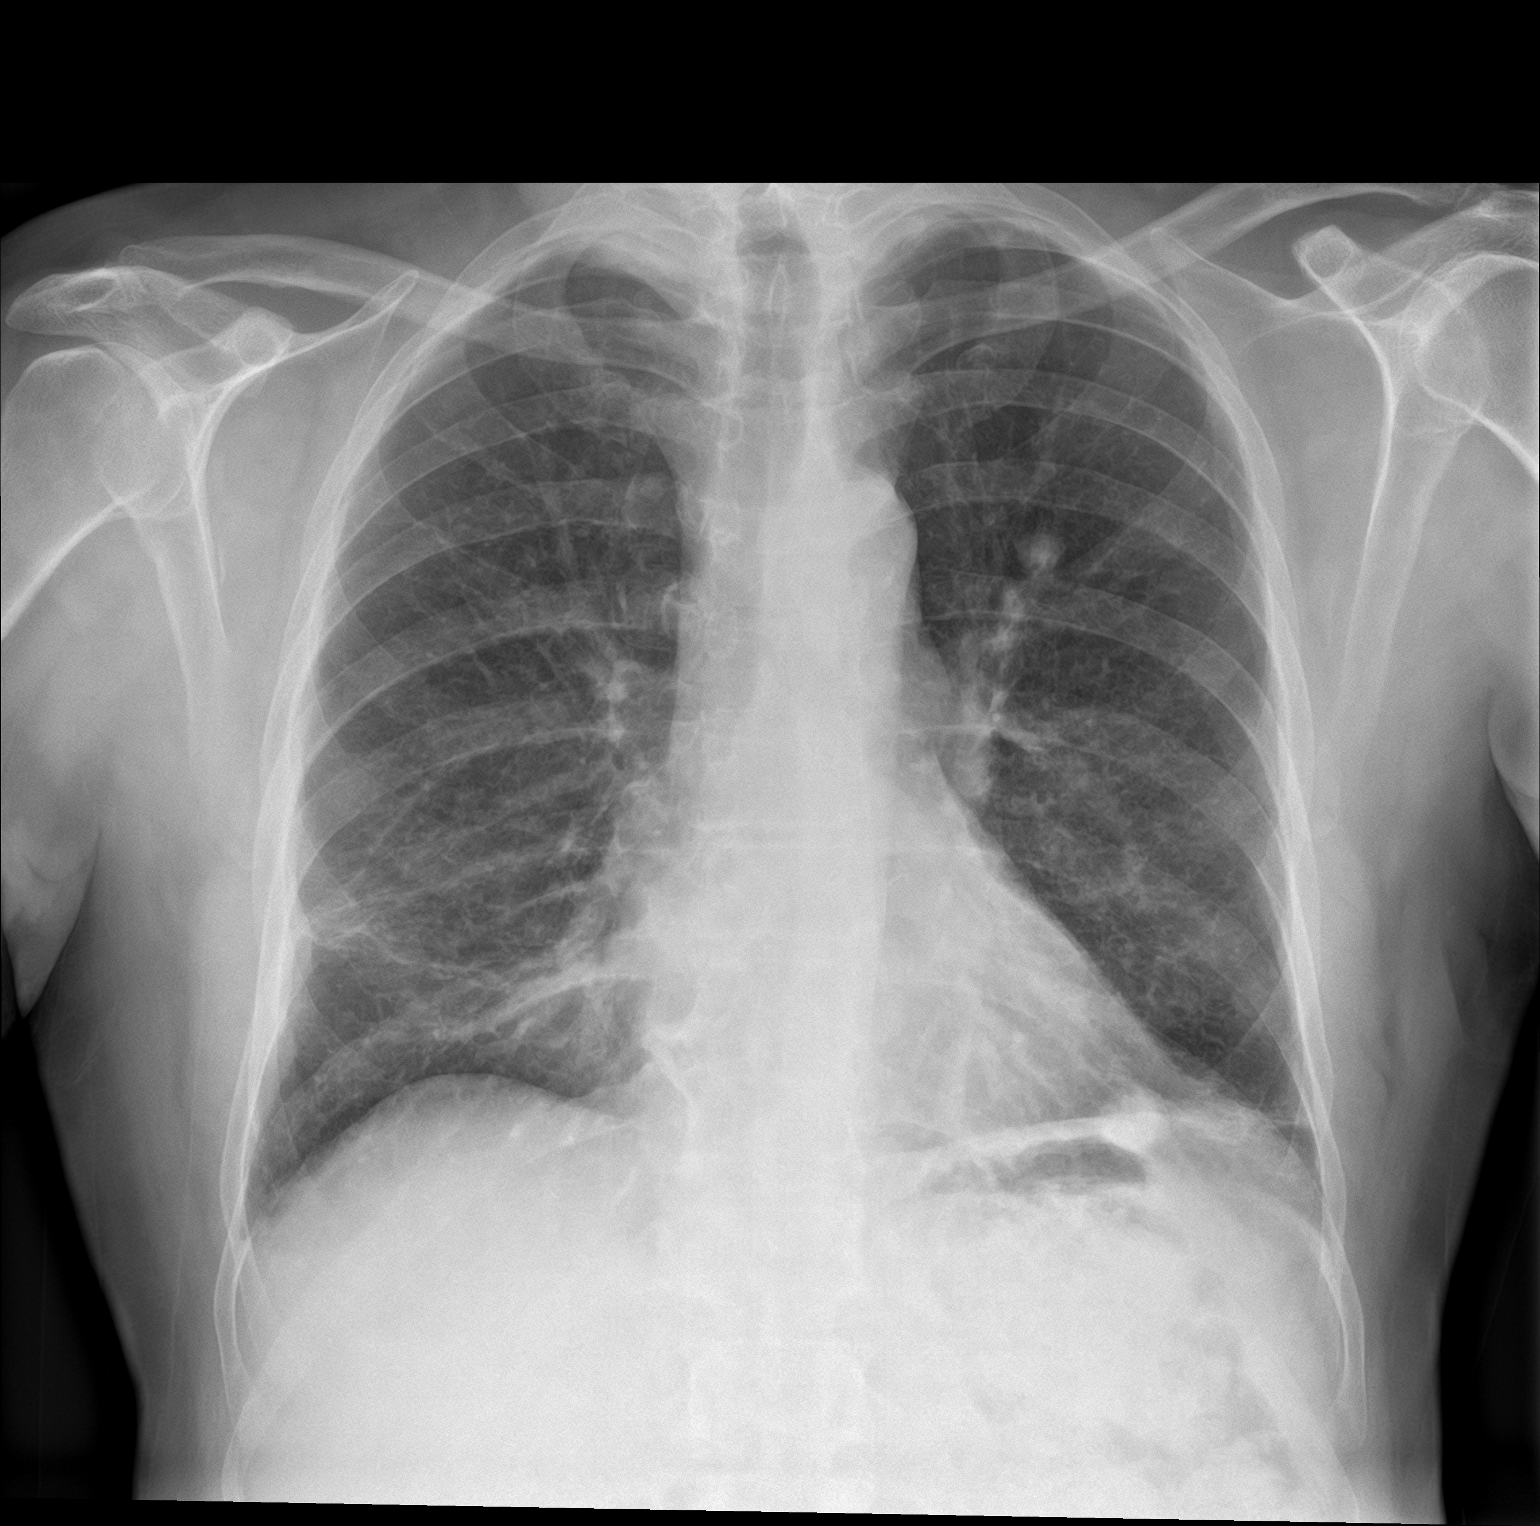

[chest lat]
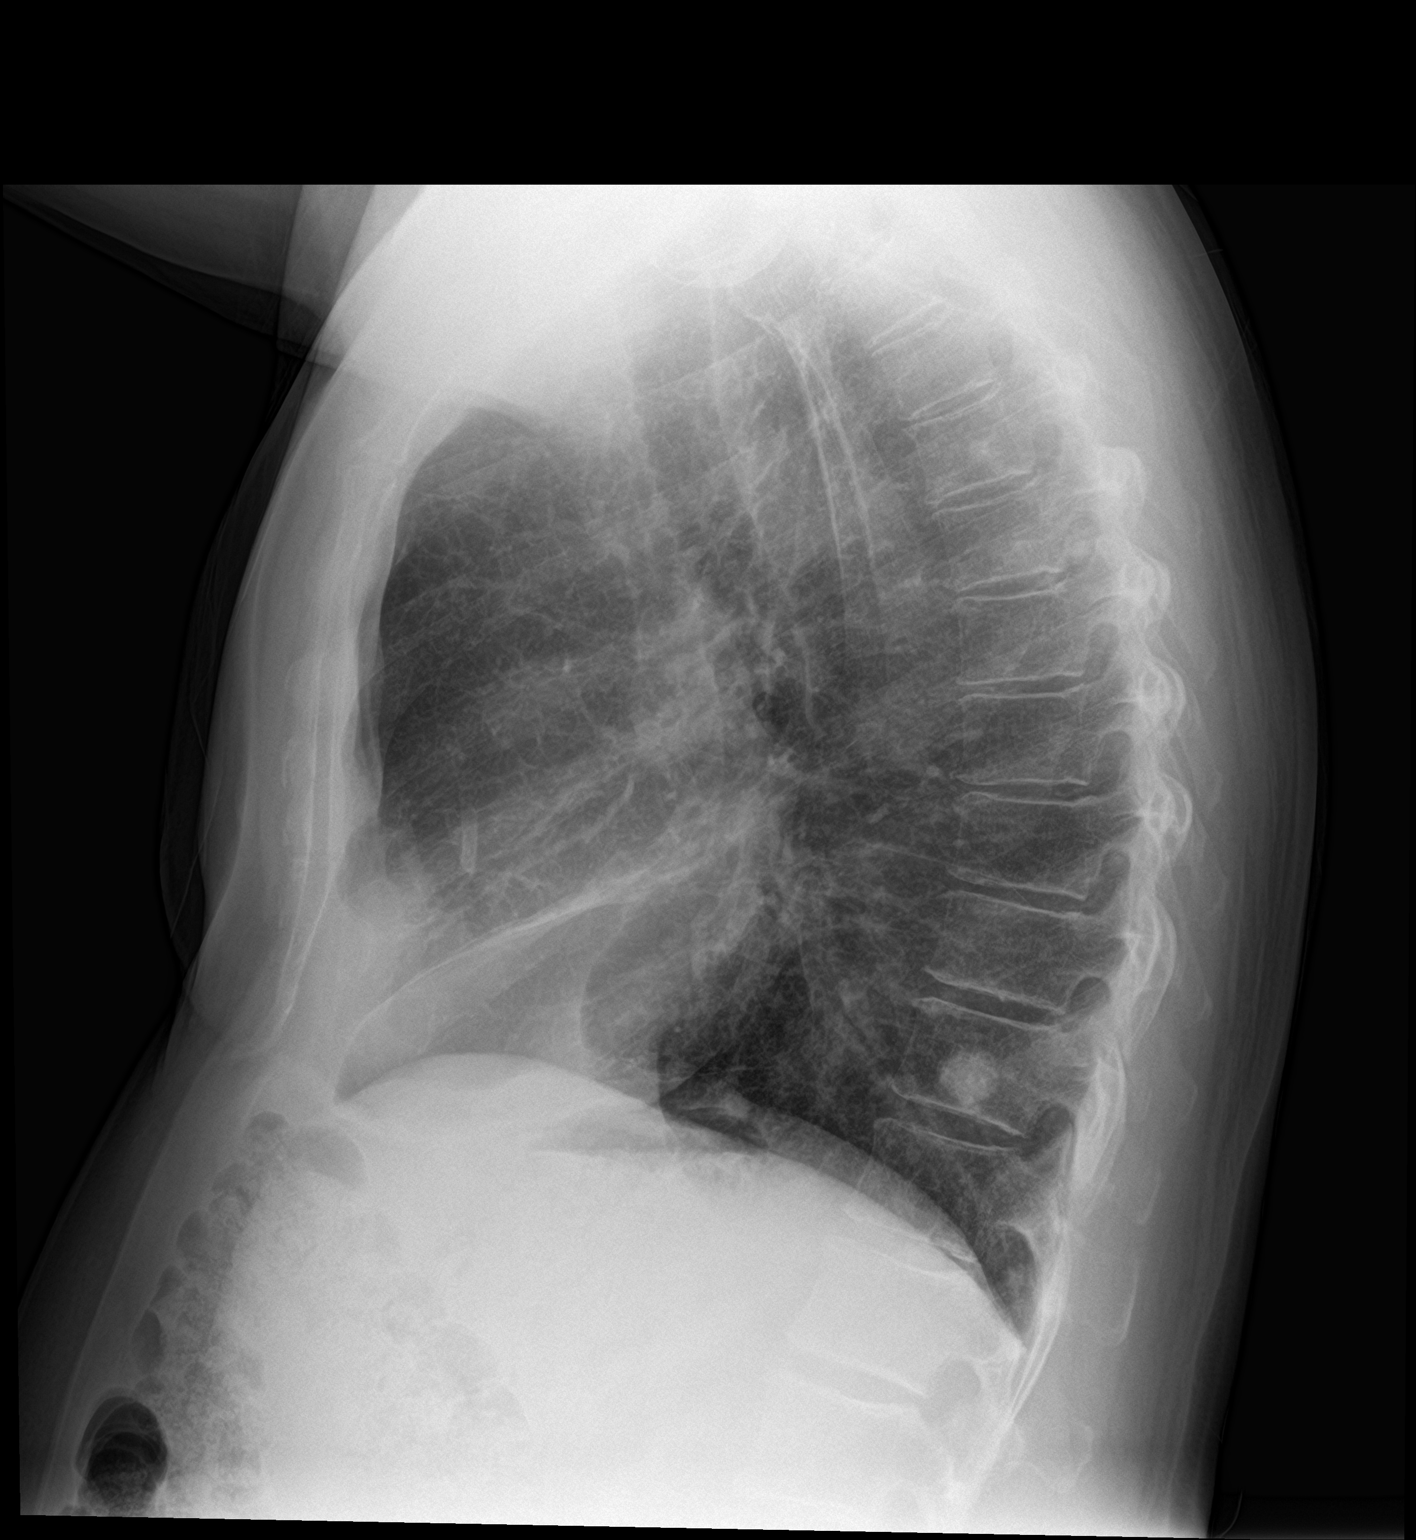

[2 of 2 positions shown; findings below may reference images not displayed]

FINDINGS: Calcified granuloma posteriorly in the left lower lobe, stable.
There is hyperinflation of the lungs compatible with COPD. Linear
areas of scarring in the lower lungs bilaterally. No confluent
airspace opacities or effusions. No acute bony abnormality.
IMPRESSION: COPD/chronic changes.  No active disease.

## 2020-03-23 DIAGNOSIS — M65331 Trigger finger, right middle finger: Secondary | ICD-10-CM | POA: Diagnosis not present

## 2020-03-23 DIAGNOSIS — Z794 Long term (current) use of insulin: Secondary | ICD-10-CM | POA: Diagnosis not present

## 2020-03-23 DIAGNOSIS — G5601 Carpal tunnel syndrome, right upper limb: Secondary | ICD-10-CM | POA: Diagnosis not present

## 2020-03-23 DIAGNOSIS — E118 Type 2 diabetes mellitus with unspecified complications: Secondary | ICD-10-CM | POA: Diagnosis not present

## 2020-03-28 DIAGNOSIS — J449 Chronic obstructive pulmonary disease, unspecified: Secondary | ICD-10-CM | POA: Diagnosis not present

## 2020-03-28 DIAGNOSIS — I2510 Atherosclerotic heart disease of native coronary artery without angina pectoris: Secondary | ICD-10-CM | POA: Diagnosis not present

## 2020-03-28 DIAGNOSIS — Z794 Long term (current) use of insulin: Secondary | ICD-10-CM | POA: Diagnosis not present

## 2020-03-28 DIAGNOSIS — E118 Type 2 diabetes mellitus with unspecified complications: Secondary | ICD-10-CM | POA: Diagnosis not present

## 2020-03-29 ENCOUNTER — Other Ambulatory Visit: Payer: Self-pay | Admitting: Surgery

## 2020-03-30 ENCOUNTER — Other Ambulatory Visit: Payer: Self-pay

## 2020-03-30 ENCOUNTER — Encounter
Admission: RE | Admit: 2020-03-30 | Discharge: 2020-03-30 | Disposition: A | Discharge status: Home / Self Care | Payer: Medicare HMO | Source: Ambulatory Visit | Attending: Surgery | Admitting: Surgery

## 2020-03-30 HISTORY — DX: Acute embolism and thrombosis of unspecified vein: I82.90

## 2020-03-30 HISTORY — DX: Ventricular premature depolarization: I49.3

## 2020-03-30 HISTORY — DX: Gastro-esophageal reflux disease without esophagitis: K21.9

## 2020-03-30 NOTE — Patient Instructions (Addendum)
Your procedure is scheduled on:04-05-20 THURSDAY Report to the Registration Desk on the 1st floor of the [facility]-Then proceed to the 2nd floor Surgery Desk in the Medical Mall To find out your arrival time, please call [phone] between 1PM - 3PM on:04-04-20 WEDNESDAY  REMEMBER: Instructions that are not followed completely may result in serious medical risk, up to and including death; or upon the discretion of your surgeon and anesthesiologist your surgery may need to be rescheduled.  Do not eat food after midnight the night before surgery.  No gum chewing, lozengers or hard candies.  You may however, drink WATER up to 2 hours before you are scheduled to arrive for your surgery. Do not drink anything within 2 hours of your scheduled arrival time.  Type 1 and Type 2 diabetics should only drink water.  TAKE THESE MEDICATIONS THE MORNING OF SURGERY WITH A SIP OF WATER: -GABAPENTIN (NEURONTIN) -METOPROLOL (TOPROL)  Stop Metformin 2 days prior to surgery-LAST DOSE ON MONDAY 04-02-20  NO INSULIN THE MORNING OF SURGERY  Follow recommendations from Cardiologist, Pulmonologist or PCP regarding stopping Aspirin, Coumadin, Plavix, Eliquis, Pradaxa, or Pletal-INSTRUCTED BY DR POGGI'S OFFICE TO STOP PLAVIX 3 DAYS PRIOR TO SURGERY-LAST DOSE ON SUNDAY 04-01-20 -CALL DR POGGI'S OFFICE AND ASK ABOUT WHEN TO STOP YOUR ASPIRIN  One week prior to surgery: Stop Anti-inflammatories (NSAIDS) such as Advil, Aleve, Ibuprofen, Motrin, Naproxen, Naprosyn and Aspirin based products such as Excedrin, Goodys Powder, BC Powder-OK TO TAKE TYLENOL IF NEEDED  Stop ANY OVER THE COUNTER supplements until after surgery. (However, you may continue taking multivitamin up until the day before surgery.)  No Alcohol for 24 hours before or after surgery.  No Smoking including e-cigarettes for 24 hours prior to surgery.  No chewable tobacco products for at least 6 hours prior to surgery.  No nicotine patches on the day of  surgery.  Do not use any "recreational" drugs for at least a week prior to your surgery.  Please be advised that the combination of cocaine and anesthesia may have negative outcomes, up to and including death. If you test positive for cocaine, your surgery will be cancelled.  On the morning of surgery brush your teeth with toothpaste and water, you may rinse your mouth with mouthwash if you wish. Do not swallow any toothpaste or mouthwash.  Do not wear jewelry, make-up, hairpins, clips or nail polish.  Do not wear lotions, powders, or perfumes.   Do not shave body from the neck down 48 hours prior to surgery just in case you cut yourself which could leave a site for infection.  Also, freshly shaved skin may become irritated if using the CHG soap.  Contact lenses, hearing aids and dentures may not be worn into surgery.  Do not bring valuables to the hospital. [facility] is not responsible for any missing/lost belongings or valuables.   Use CHG Soap as directed on instruction sheet.  Notify your doctor if there is any change in your medical condition (cold, fever, infection).  Wear comfortable clothing (specific to your surgery type) to the hospital.  Plan for stool softeners for home use; pain medications have a tendency to cause constipation. You can also help prevent constipation by eating foods high in fiber such as fruits and vegetables and drinking plenty of fluids as your diet allows.  After surgery, you can help prevent lung complications by doing breathing exercises.  Take deep breaths and cough every 1-2 hours. Your doctor may order a device called  an Facilities manager to help you take deep breaths. When coughing or sneezing, hold a pillow firmly against your incision with both hands. This is called "splinting." Doing this helps protect your incision. It also decreases belly discomfort.  If you are being admitted to the hospital overnight, leave your suitcase in the  car. After surgery it may be brought to your room.  If you are being discharged the day of surgery, you will not be allowed to drive home. You will need a responsible adult (18 years or older) to drive you home and stay with you that night.   If you are taking public transportation, you will need to have a responsible adult (18 years or older) with you. Please confirm with your physician that it is acceptable to use public transportation.   Please call the Pre-admissions Testing Dept. at [phone] if you have any questions about these instructions.  Visitation Policy:  Patients undergoing a surgery or procedure may have one family member or support person with them as long as that person is not COVID-19 positive or experiencing its symptoms.  That person may remain in the waiting area during the procedure.  Inpatient Visitation:    Visiting hours are 7 a.m. to 8 p.m. Patients will be allowed one visitor. The visitor may change daily. The visitor must pass COVID-19 screenings, use hand sanitizer when entering and exiting the patient's room and wear a mask at all times, including in the patient's room. Patients must also wear a mask when staff or their visitor are in the room. Masking is required regardless of vaccination status. Systemwide, no visitors 17 or younger.

## 2020-04-02 ENCOUNTER — Other Ambulatory Visit: Payer: Self-pay

## 2020-04-02 ENCOUNTER — Encounter
Admission: RE | Admit: 2020-04-02 | Discharge: 2020-04-02 | Disposition: A | Discharge status: Home / Self Care | Payer: Medicare HMO | Source: Ambulatory Visit | Attending: Surgery | Admitting: Surgery

## 2020-04-02 DIAGNOSIS — I10 Essential (primary) hypertension: Secondary | ICD-10-CM | POA: Diagnosis not present

## 2020-04-02 DIAGNOSIS — Z01818 Encounter for other preprocedural examination: Secondary | ICD-10-CM | POA: Insufficient documentation

## 2020-04-02 DIAGNOSIS — I219 Acute myocardial infarction, unspecified: Secondary | ICD-10-CM | POA: Insufficient documentation

## 2020-04-02 LAB — CBC
HCT: 40.8 % (ref 39.0–52.0)
Hemoglobin: 13.7 g/dL (ref 13.0–17.0)
MCH: 31.4 pg (ref 26.0–34.0)
MCHC: 33.6 g/dL (ref 30.0–36.0)
MCV: 93.6 fL (ref 80.0–100.0)
Platelets: 306 10*3/uL (ref 150–400)
RBC: 4.36 MIL/uL (ref 4.22–5.81)
RDW: 12.5 % (ref 11.5–15.5)
WBC: 8.4 10*3/uL (ref 4.0–10.5)
nRBC: 0 % (ref 0.0–0.2)

## 2020-04-02 NOTE — Progress Notes (Signed)
[facility] Perioperative Services  Pre-Admission/Anesthesia Testing Clinical Review  Date: 04/03/20  Patient Demographics:  Name: Gregory Mendoza DOB:   1945-07-13 MRN:   [id number]  Planned Surgical Procedure(s):    Case: [id number] Date/Time: 04/05/20 1221   Procedures:      CARPAL TUNNEL RELEASE ENDOSCOPIC (Right Wrist)     RIGHT MIDDLE FINGER TRIGGER RELEASE (Right Middle Finger)   Anesthesia type: Choice   Pre-op diagnosis:      Trigger middle finger of right hand M65.331     Carpal tunnel syndrome of right wrist G56.01   Location: [facility] OR ROOM 03 / [place] [facility] FOR ANESTHESIA GROUP   Surgeons: Corky Mull, MD    NOTE: Available PAT nursing documentation and vital signs have been reviewed. Clinical nursing staff has updated patient's PMH/PSHx, current medication list, and drug allergies/intolerances to ensure comprehensive history available to assist in medical decision making as it pertains to the aforementioned surgical procedure and anticipated anesthetic course.   Clinical Discussion:  Gregory Mendoza is a 75 y.o. male who is submitted for pre-surgical anesthesia review and clearance prior to him undergoing the above procedure. Patient is a Former Smoker (80 pack years; quit 02/1997). Pertinent PMH includes: MI x 3, dysrhythmia, symptomatic PVCs, HTN, HLD, T2DM, COPD, GERD (uses OTC interventions, OA, peripheral neuropathy, chronic pain syndrome  Patient is followed by cardiology Saralyn Pilar, MD). He was last seen in the cardiology clinic on 04/01/2019; notes reviewed.  At the time of his clinic visit, patient noted to be doing well overall from a cardiovascular perspective.  Patient denied chest pain.  He endorsed chronic exertional dyspnea and noted that his respiratory status was at baseline.  No PND, orthopnea, peripheral edema, or presyncope/syncope.  Patient with occasional palpitations.  Patient active; walks 2.5 miles a day. Functional capacity, as defined by  DASI, is documented as being >/= 4 METS.  Patient with a significant cardiovascular history.  Patient has suffered MI x3 (2006, 2008, and 2019); each time patient underwent PCI.  Patient admitted with NSTEMI back on 12/14/2017 whereby overlapping DES was placed to the proximal mid RCA.  Patient was placed on Brilinta, however delayed filling his prescription resulting in-stent restenosis.  Patient admitted again on 12/17/2017 with an inferior STEMI requiring thrombectomy and PCTA.  Patient was continued on chronic DAPT therapy to prevent in-stent restenosis.  Myocardial perfusion imaging study performed on 03/30/2019 revealed normal left ventricular systolic function (LVEF 03%) with no evidence of RWMAs, scar or arrhythmia; no evidence of stress-induced myocardial ischemia (see full interpretation of cardiovascular testing below).  Patient on GDMT for his HTN and HLD diagnoses.  Blood pressure well controlled at 122/72 on currently prescribed nitrate, ACEi,, and beta-blocker therapies.  Patient on a statin for his HLD.  T2DM moderately controlled on currently prescribed regimen; last Hgb A1c on 03/16/2019 was 8.0%.  No changes were made to patient's medication regimen at that time.  Patient to follow-up with outpatient cardiology in 4 months or sooner if needed.  Patient scheduled to undergo an elective orthopedic procedure on 04/05/2020 with Dr. Milagros Evener.  Given patient's PMH significant for cardiovascular disease and diagnoses, presurgical cardiac clearance was sought by the PAT team.  Per cardiology, "this patient is optimized for surgery and may proceed with the planned procedural course with a LOW risk stratification".  This patient is currently on DAPT therapy.  He has been instructed on recommendations from cardiology for holding his clopidogrel for 5 days prior to his procedure with  plans to resume 1 day postoperatively.  Cardiology asking the patient to continue his daily low-dose ASA throughout his  perioperative course.  He denies previous perioperative complications with anesthesia. He underwent a general anesthetic course here (ASA III) in 03/2019 with no documented complications.   Vitals with BMI 09/28/2019 09/05/2019 04/19/2019  Height 5' 6"  5' 6"  -  Weight 155 lbs 155 lbs -  BMI 35.46 56.81 -  Systolic 275 170 017  Diastolic 95 82 71  Pulse 73 64 85    Providers/Specialists:   NOTE: Primary physician provider listed below. Patient may have been seen by APP or partner within same practice.   PROVIDER ROLE / SPECIALTY LAST OV  Poggi, Marshall Cork, MD Orthopedics (Surgeon)  03/23/2020  Kirk Ruths, MD Primary Care Provider  11/30/2019  Isaias Cowman, MD Cardiology  04/01/2019   Allergies:  Patient has no known allergies.  Current Home Medications:   No current facility-administered medications for this encounter.   Marland Kitchen acetaminophen (TYLENOL) 500 MG tablet  . aspirin EC 81 MG tablet  . atorvastatin (LIPITOR) 10 MG tablet  . clopidogrel (PLAVIX) 75 MG tablet  . diphenhydrAMINE (BENADRYL) 25 MG tablet  . gabapentin (NEURONTIN) 600 MG tablet  . glimepiride (AMARYL) 4 MG tablet  . insulin NPH Human (HUMULIN N,NOVOLIN N) 100 UNIT/ML injection  . isosorbide mononitrate (IMDUR) 30 MG 24 hr tablet  . lisinopril (PRINIVIL,ZESTRIL) 10 MG tablet  . metFORMIN (GLUCOPHAGE) 1000 MG tablet  . metoprolol (LOPRESSOR) 50 MG tablet  . Multiple Vitamin (MULTIVITAMIN WITH MINERALS) TABS tablet  . nitroGLYCERIN (NITROSTAT) 0.3 MG SL tablet  . pioglitazone (ACTOS) 15 MG tablet  . calcium carbonate (TUMS - DOSED IN MG ELEMENTAL CALCIUM) 500 MG chewable tablet   History:   Past Medical History:  Diagnosis Date  . Arthritis    NECK  . Chronic back pain   . COPD (chronic obstructive pulmonary disease) ([place])   . Diabetes mellitus without complication ([place])   . Dyspnea    OCC  . Dysrhythmia   . GERD (gastroesophageal reflux disease)   . Hyperlipidemia   . Hypertension   .  Myocardial infarction ([place]) 2007, 2009, 2020  . Peripheral neuropathy   . Symptomatic PVCs   . Thrombosis    IN STENT AFTER MI   Past Surgical History:  Procedure Laterality Date  . APPENDECTOMY  1970's  . BACK SURGERY  1997   Discectomy L4-L5  . CARDIAC CATHETERIZATION    . COLONOSCOPY  2017  . CORONARY STENT INTERVENTION N/A 12/14/2017   Procedure: CORONARY STENT INTERVENTION;  Surgeon: Yolonda Kida, MD;  Location: [place] [facility];  Service: Cardiovascular;  Laterality: N/A;  . CORONARY/GRAFT ACUTE MI REVASCULARIZATION N/A 12/18/2017   Procedure: Coronary/Graft Acute MI Revascularization;  Surgeon: Yolonda Kida, MD;  Location: [place] [facility];  Service: Cardiovascular;  Laterality: N/A;  . HERNIA REPAIR Left 02/08/2008   Dr Bary Castilla  . INGUINAL HERNIA REPAIR Right 1999   with mesh while in [place]  . LEFT HEART CATH AND CORONARY ANGIOGRAPHY N/A 12/14/2017   Procedure: LEFT HEART CATH AND CORONARY ANGIOGRAPHY;  Surgeon: Corey Skains, MD;  Location: [place] [facility];  Service: Cardiovascular;  Laterality: N/A;  . LEFT HEART CATH AND CORONARY ANGIOGRAPHY N/A 12/18/2017   Procedure: LEFT HEART CATH AND CORONARY ANGIOGRAPHY;  Surgeon: Yolonda Kida, MD;  Location: [place] [facility];  Service: Cardiovascular;  Laterality: N/A;  . SHOULDER ARTHROSCOPY WITH ROTATOR CUFF REPAIR AND  OPEN BICEPS TENODESIS Right 04/19/2019   Procedure: RIGHT SHOULDER ARTHROSCOPY WITH DEBRIDEMENT, DECOMPRESSION, ROTATOR CUFF REPAIR, AND POSSIBLE BICEPS TENODESIS.;  Surgeon: Corky Mull, MD;  Location: [facility] ORS;  Service: Orthopedics;  Laterality: Right;  . THROMBECTOMY     IN STENT   . trigger thumb Right    No family history on file. Social History   Tobacco Use  . Smoking status: Former Smoker    Packs/day: 2.00    Years: 40.00    Pack years: 80.00    Types: Cigarettes    Quit date: 02/24/1997    Years since quitting: 23.1  . Smokeless tobacco: Never Used  Vaping  Use  . Vaping Use: Never used  Substance Use Topics  . Alcohol use: No  . Drug use: No    Pertinent Clinical Results:  LABS: Labs reviewed: Acceptable for surgery.  Hospital Outpatient Visit on 04/02/2020  Component Date Value Ref Range Status  . WBC 04/02/2020 8.4  4.0 - 10.5 K/uL Final  . RBC 04/02/2020 4.36  4.22 - 5.81 MIL/uL Final  . Hemoglobin 04/02/2020 13.7  13.0 - 17.0 g/dL Final  . HCT 04/02/2020 40.8  39.0 - 52.0 % Final  . MCV 04/02/2020 93.6  80.0 - 100.0 fL Final  . MCH 04/02/2020 31.4  26.0 - 34.0 pg Final  . MCHC 04/02/2020 33.6  30.0 - 36.0 g/dL Final  . RDW 04/02/2020 12.5  11.5 - 15.5 % Final  . Platelets 04/02/2020 306  150 - 400 K/uL Final  . nRBC 04/02/2020 0.0  0.0 - 0.2 % Final   Performed at [facility], [address] [place], [place] [address]        ECG: Date: 04/02/2020 Time ECG obtained: 1132 AM Rate: 103 bpm Rhythm: Sinus tachycardia with PVCs Axis (leads I and aVF): Left axis deviation Intervals: PR 126 ms. QRS's 88 ms. QTc 461 ms. ST segment and T wave changes: No evidence of acute ST segment elevation or depression Comparison: Similar to previous tracing obtained on 03/29/2019   IMAGING / PROCEDURES: LEXISCAN performed on 03/30/2019 1. LVEF 52% 2. Regional wall motion reveals normal myocardial thickening and wall motion 3. There is no evidence of stress-induced myocardial ischemia or arrhythmia. 4. There was no evidence of scar 5. There were no artifacts noted 6. The left ventricular cavity size was normal 7. The overall quality of study is good  LEFT HEART CATHETERIZATION AND CORONARY ANGIOGRAPHY performed on 12/18/2017 1. Acute in-stent thrombosis in the proximal RCA stent that was placed less than 72 hours ago  Proximal RCA lesion is 100% stenosed  Mid RCA to distal RCA lesion is 100% stenosed 2. Successful balloon angioplasty and thrombectomy 3. TIMI 0 to TIMI III flow 4. Continue Aggrastat for 24  hours 5. Resume Brilinta and aspirin 6. Recommend uninterrupted DAPT therapy with low-dose aspirin and ticagrelor twice daily for minimum of 12 months    LEFT HEART CATHETERIZATION AND CORONARY ANGIOGRAPHY performed on 12/14/2017 1. Successful PCI and stent of in-stent restenosis of the proximal RCA stent with a new DES 3.0 x 28 mm Xience [place] post dilated with a 3.5 x 15 mm [place] to 14 atm  Ostial LAD to proximal LAD lesion is 40% stenosed  Proximal circumflex lesions 50% stenosed  Proximal RCA lesion is 95% stenosis  DES was successfully placed  Postintervention, there was a 0% residual stenosis 2. PCI and direct stent to 75% mid RCA lesion 2.5 x 15 mm Xience [place] to  14 atm lesion was reduced to 0%  Mid RCA lesion 75% stenosed  A DES was successfully placed  Postintervention, there was a 0% residual stenosis 3. Recommend uninterrupted dual antiplatelet therapy with Aspirin 69m daily and Ticagrelor 97mtwice daily for a minimum of 12 months       ECHOCARDIOGRAM performed on 12/13/2017 1. LVEF 55-60% 2. Left ventricular cavity size was normal 3. Left ventricular systolic function was normal 4. Left atrium was normal in size 5. Right ventricular cavity size was normal 6. Right ventricular wall thickness was normal 7. Right ventricular systolic function was normal 8. Aortic valve structurally normal with no evidence of regurgitation or stenosis 9. Mitral valve structurally normal.  There was no evidence of regurgitation or stenosis 10. Tricuspid valve structurally normal.  There was evidence of trivial regurgitation 11. The aorta was normal, not dilated, and not diseased   Impression and Plan:  LaTaysean Wageras been referred for pre-anesthesia review and clearance prior to him undergoing the planned anesthetic and procedural courses. Available labs, pertinent testing, and imaging results were personally reviewed by me. This patient has been appropriately cleared by  cardiology with an overall LOW risk for significant perioperative cardiovascular complications..   Based on clinical review performed today (04/03/20), barring any significant acute changes in the patient's overall condition, it is anticipated that he will be able to proceed with the planned surgical intervention. Any acute changes in clinical condition may necessitate his procedure being postponed and/or cancelled. Pre-surgical instructions were reviewed with the patient during his PAT appointment and questions were fielded by PAT clinical staff.  BrHonor LohMSN, APRN, FNP-C, CEN [facility] Nurse Practitioner Phone: ([phone]/08/22 2:46 PM  NOTE: This note has been prepared using Dragon dictation software. Despite my best ability to proofread, there is always the potential that unintentional transcriptional errors may still occur from this process.

## 2020-04-03 ENCOUNTER — Other Ambulatory Visit
Admission: RE | Admit: 2020-04-03 | Discharge: 2020-04-03 | Disposition: A | Discharge status: Home / Self Care | Payer: Medicare HMO | Source: Ambulatory Visit | Attending: Surgery | Admitting: Surgery

## 2020-04-03 DIAGNOSIS — Z01812 Encounter for preprocedural laboratory examination: Secondary | ICD-10-CM | POA: Diagnosis not present

## 2020-04-03 DIAGNOSIS — Z20822 Contact with and (suspected) exposure to covid-19: Secondary | ICD-10-CM | POA: Insufficient documentation

## 2020-04-03 LAB — SARS CORONAVIRUS 2 (TAT 6-24 HRS): SARS Coronavirus 2: NEGATIVE

## 2020-04-04 DIAGNOSIS — J449 Chronic obstructive pulmonary disease, unspecified: Secondary | ICD-10-CM | POA: Diagnosis not present

## 2020-04-04 DIAGNOSIS — E118 Type 2 diabetes mellitus with unspecified complications: Secondary | ICD-10-CM | POA: Diagnosis not present

## 2020-04-04 DIAGNOSIS — H6983 Other specified disorders of Eustachian tube, bilateral: Secondary | ICD-10-CM | POA: Diagnosis not present

## 2020-04-04 DIAGNOSIS — H9313 Tinnitus, bilateral: Secondary | ICD-10-CM | POA: Diagnosis not present

## 2020-04-04 DIAGNOSIS — Z794 Long term (current) use of insulin: Secondary | ICD-10-CM | POA: Diagnosis not present

## 2020-04-04 DIAGNOSIS — I2510 Atherosclerotic heart disease of native coronary artery without angina pectoris: Secondary | ICD-10-CM | POA: Diagnosis not present

## 2020-04-04 DIAGNOSIS — J301 Allergic rhinitis due to pollen: Secondary | ICD-10-CM | POA: Diagnosis not present

## 2020-04-04 DIAGNOSIS — I10 Essential (primary) hypertension: Secondary | ICD-10-CM | POA: Diagnosis not present

## 2020-04-04 DIAGNOSIS — H903 Sensorineural hearing loss, bilateral: Secondary | ICD-10-CM | POA: Diagnosis not present

## 2020-04-04 DIAGNOSIS — H93A3 Pulsatile tinnitus, bilateral: Secondary | ICD-10-CM | POA: Diagnosis not present

## 2020-04-05 ENCOUNTER — Ambulatory Visit
Admission: RE | Admit: 2020-04-05 | Discharge: 2020-04-05 | Disposition: A | Discharge status: Home / Self Care | Payer: Medicare HMO | Attending: Surgery | Admitting: Surgery

## 2020-04-05 ENCOUNTER — Ambulatory Visit: Payer: Medicare HMO | Admitting: Urgent Care

## 2020-04-05 ENCOUNTER — Other Ambulatory Visit: Payer: Self-pay

## 2020-04-05 ENCOUNTER — Encounter
Admission: RE | Disposition: A | Discharge status: Home / Self Care | Payer: Self-pay | Source: Home / Self Care | Attending: Surgery

## 2020-04-05 ENCOUNTER — Encounter: Payer: Self-pay | Admitting: Surgery

## 2020-04-05 DIAGNOSIS — Z794 Long term (current) use of insulin: Secondary | ICD-10-CM | POA: Diagnosis not present

## 2020-04-05 DIAGNOSIS — M65331 Trigger finger, right middle finger: Secondary | ICD-10-CM | POA: Insufficient documentation

## 2020-04-05 DIAGNOSIS — I10 Essential (primary) hypertension: Secondary | ICD-10-CM | POA: Diagnosis not present

## 2020-04-05 DIAGNOSIS — Z79899 Other long term (current) drug therapy: Secondary | ICD-10-CM | POA: Insufficient documentation

## 2020-04-05 DIAGNOSIS — G5601 Carpal tunnel syndrome, right upper limb: Secondary | ICD-10-CM | POA: Insufficient documentation

## 2020-04-05 DIAGNOSIS — Z8249 Family history of ischemic heart disease and other diseases of the circulatory system: Secondary | ICD-10-CM | POA: Insufficient documentation

## 2020-04-05 DIAGNOSIS — Z7902 Long term (current) use of antithrombotics/antiplatelets: Secondary | ICD-10-CM | POA: Insufficient documentation

## 2020-04-05 DIAGNOSIS — I2510 Atherosclerotic heart disease of native coronary artery without angina pectoris: Secondary | ICD-10-CM | POA: Diagnosis not present

## 2020-04-05 DIAGNOSIS — I252 Old myocardial infarction: Secondary | ICD-10-CM | POA: Insufficient documentation

## 2020-04-05 DIAGNOSIS — K219 Gastro-esophageal reflux disease without esophagitis: Secondary | ICD-10-CM | POA: Diagnosis not present

## 2020-04-05 DIAGNOSIS — Z7982 Long term (current) use of aspirin: Secondary | ICD-10-CM | POA: Insufficient documentation

## 2020-04-05 DIAGNOSIS — E1140 Type 2 diabetes mellitus with diabetic neuropathy, unspecified: Secondary | ICD-10-CM | POA: Diagnosis not present

## 2020-04-05 DIAGNOSIS — E119 Type 2 diabetes mellitus without complications: Secondary | ICD-10-CM | POA: Insufficient documentation

## 2020-04-05 DIAGNOSIS — E785 Hyperlipidemia, unspecified: Secondary | ICD-10-CM | POA: Diagnosis not present

## 2020-04-05 HISTORY — PX: TRIGGER FINGER RELEASE: SHX641

## 2020-04-05 HISTORY — PX: CARPAL TUNNEL RELEASE: SHX101

## 2020-04-05 LAB — GLUCOSE, CAPILLARY
Glucose-Capillary: 153 mg/dL — ABNORMAL HIGH (ref 70–99)
Glucose-Capillary: 117 mg/dL — ABNORMAL HIGH (ref 70–99)

## 2020-04-05 SURGERY — RELEASE, CARPAL TUNNEL, ENDOSCOPIC
Anesthesia: General | Site: Wrist | Laterality: Right

## 2020-04-05 MED ORDER — TRAMADOL HCL 50 MG PO TABS: 50.0000 mg | ORAL_TABLET | Freq: Four times a day (QID) | ORAL | Status: DC | PRN

## 2020-04-05 MED ORDER — METOCLOPRAMIDE HCL 10 MG PO TABS: 5.0000 mg | ORAL_TABLET | Freq: Three times a day (TID) | ORAL | Status: DC | PRN

## 2020-04-05 MED ORDER — FENTANYL CITRATE (PF) 100 MCG/2ML IJ SOLN: 25.0000 ug | INTRAMUSCULAR | Status: DC | PRN

## 2020-04-05 MED ORDER — ONDANSETRON HCL 4 MG/2ML IJ SOLN: 4.0000 mg | Freq: Four times a day (QID) | INTRAMUSCULAR | Status: DC | PRN

## 2020-04-05 MED ORDER — PHENYLEPHRINE HCL (PRESSORS) 10 MG/ML IV SOLN
INTRAVENOUS | Status: DC | PRN
  Administered 2020-04-05 (×4): 100 ug via INTRAVENOUS

## 2020-04-05 MED ORDER — ORAL CARE MOUTH RINSE: 15.0000 mL | Freq: Once | OROMUCOSAL | Status: AC

## 2020-04-05 MED ORDER — ONDANSETRON HCL 4 MG/2ML IJ SOLN
INTRAMUSCULAR | Status: DC | PRN
  Administered 2020-04-05: 4 mg via INTRAVENOUS

## 2020-04-05 MED ORDER — FENTANYL CITRATE (PF) 100 MCG/2ML IJ SOLN
INTRAMUSCULAR | Status: DC | PRN
  Administered 2020-04-05 (×4): 25 ug via INTRAVENOUS

## 2020-04-05 MED ORDER — PROPOFOL 10 MG/ML IV BOLUS
INTRAVENOUS | Status: DC | PRN
  Administered 2020-04-05: 120 mg via INTRAVENOUS

## 2020-04-05 MED ORDER — CEFAZOLIN SODIUM-DEXTROSE 2-4 GM/100ML-% IV SOLN
2.0000 g | INTRAVENOUS | Status: AC
  Administered 2020-04-05: 2 g via INTRAVENOUS

## 2020-04-05 MED ORDER — LIDOCAINE HCL (CARDIAC) PF 100 MG/5ML IV SOSY
PREFILLED_SYRINGE | INTRAVENOUS | Status: DC | PRN
  Administered 2020-04-05: 80 mg via INTRAVENOUS

## 2020-04-05 MED ORDER — FENTANYL CITRATE (PF) 100 MCG/2ML IJ SOLN
INTRAMUSCULAR | Status: AC
  Filled 2020-04-05: qty 2

## 2020-04-05 MED ORDER — FAMOTIDINE 20 MG PO TABS
ORAL_TABLET | ORAL | Status: AC
  Administered 2020-04-05: 20 mg
  Filled 2020-04-05: qty 1

## 2020-04-05 MED ORDER — PROPOFOL 10 MG/ML IV BOLUS
INTRAVENOUS | Status: AC
  Filled 2020-04-05: qty 40

## 2020-04-05 MED ORDER — METOCLOPRAMIDE HCL 5 MG/ML IJ SOLN: 5.0000 mg | Freq: Three times a day (TID) | INTRAMUSCULAR | Status: DC | PRN

## 2020-04-05 MED ORDER — CHLORHEXIDINE GLUCONATE 0.12 % MT SOLN: 15.0000 mL | Freq: Once | OROMUCOSAL | Status: AC

## 2020-04-05 MED ORDER — ONDANSETRON HCL 4 MG PO TABS: 4.0000 mg | ORAL_TABLET | Freq: Four times a day (QID) | ORAL | Status: DC | PRN

## 2020-04-05 MED ORDER — SODIUM CHLORIDE 0.9 % IV SOLN: INTRAVENOUS | Status: DC

## 2020-04-05 MED ORDER — CHLORHEXIDINE GLUCONATE 0.12 % MT SOLN
OROMUCOSAL | Status: AC
  Administered 2020-04-05: 15 mL via OROMUCOSAL
  Filled 2020-04-05: qty 15

## 2020-04-05 MED ORDER — CEFAZOLIN SODIUM-DEXTROSE 2-4 GM/100ML-% IV SOLN
INTRAVENOUS | Status: AC
  Filled 2020-04-05: qty 100

## 2020-04-05 MED ORDER — ONDANSETRON HCL 4 MG/2ML IJ SOLN: 4.0000 mg | Freq: Once | INTRAMUSCULAR | Status: DC | PRN

## 2020-04-05 SURGICAL SUPPLY — 37 items
APL PRP STRL LF DISP 70% ISPRP (MISCELLANEOUS) ×2
BNDG COHESIVE 1X5 TAN NS LF (GAUZE/BANDAGES/DRESSINGS) ×3 IMPLANT
BNDG COHESIVE 4X5 TAN STRL (GAUZE/BANDAGES/DRESSINGS) ×3 IMPLANT
BNDG CONFORM 2 STRL LF (GAUZE/BANDAGES/DRESSINGS) ×3 IMPLANT
BNDG ELASTIC 2X5.8 VLCR STR LF (GAUZE/BANDAGES/DRESSINGS) ×3 IMPLANT
BNDG ESMARK 4X12 TAN STRL LF (GAUZE/BANDAGES/DRESSINGS) ×3 IMPLANT
CANISTER SUCT 1200ML W/VALVE (MISCELLANEOUS) ×3 IMPLANT
CHLORAPREP W/TINT 26 (MISCELLANEOUS) ×3 IMPLANT
CORD BIP STRL DISP 12FT (MISCELLANEOUS) ×3 IMPLANT
COVER WAND RF STERILE (DRAPES) ×3 IMPLANT
CUFF TOURN SGL QUICK 18X4 (TOURNIQUET CUFF) ×3 IMPLANT
DRAPE SURG 17X11 SM STRL (DRAPES) ×6 IMPLANT
FORCEPS JEWEL BIP 4-3/4 STR (INSTRUMENTS) ×3 IMPLANT
GAUZE SPONGE 4X4 12PLY STRL (GAUZE/BANDAGES/DRESSINGS) ×3 IMPLANT
GAUZE XEROFORM 1X8 LF (GAUZE/BANDAGES/DRESSINGS) ×3 IMPLANT
GLOVE BIO SURGEON STRL SZ8 (GLOVE) ×6 IMPLANT
GLOVE INDICATOR 8.0 STRL GRN (GLOVE) ×3 IMPLANT
GOWN STRL REUS W/ TWL LRG LVL3 (GOWN DISPOSABLE) ×2 IMPLANT
GOWN STRL REUS W/ TWL XL LVL3 (GOWN DISPOSABLE) ×2 IMPLANT
GOWN STRL REUS W/TWL LRG LVL3 (GOWN DISPOSABLE) ×3
GOWN STRL REUS W/TWL XL LVL3 (GOWN DISPOSABLE) ×3
KIT CARPAL TUNNEL (MISCELLANEOUS) ×3
KIT ESCP INSRT D SLOT CANN KN (MISCELLANEOUS) ×2 IMPLANT
KIT TURNOVER KIT A (KITS) ×3 IMPLANT
MANIFOLD NEPTUNE II (INSTRUMENTS) ×3 IMPLANT
NDL HYPO 25X1 1.5 SAFETY (NEEDLE) ×2 IMPLANT
NEEDLE HYPO 25X1 1.5 SAFETY (NEEDLE) ×3 IMPLANT
NS IRRIG 500ML POUR BTL (IV SOLUTION) ×3 IMPLANT
PACK EXTREMITY ARMC (MISCELLANEOUS) ×3 IMPLANT
SPLINT WRIST LG LT TX990309 (SOFTGOODS) IMPLANT
SPLINT WRIST LG RT TX900304 (SOFTGOODS) IMPLANT
SPLINT WRIST M LT TX990308 (SOFTGOODS) IMPLANT
SPLINT WRIST M RT TX990303 (SOFTGOODS) IMPLANT
SPLINT WRIST XL LT TX990310 (SOFTGOODS) IMPLANT
SPLINT WRIST XL RT TX990305 (SOFTGOODS) IMPLANT
STOCKINETTE IMPERVIOUS 9X36 MD (GAUZE/BANDAGES/DRESSINGS) ×3 IMPLANT
SUT PROLENE 4 0 PS 2 18 (SUTURE) ×3 IMPLANT

## 2020-04-05 NOTE — Anesthesia Postprocedure Evaluation (Signed)
Anesthesia Post Note  Patient: Gregory Mendoza  Procedure(s) Performed: CARPAL TUNNEL RELEASE ENDOSCOPIC (Right Wrist) RIGHT MIDDLE FINGER TRIGGER RELEASE (Right Middle Finger)  Patient location during evaluation: PACU Anesthesia Type: General Level of consciousness: awake and alert Pain management: pain level controlled Vital Signs Assessment: post-procedure vital signs reviewed and stable Respiratory status: spontaneous breathing and respiratory function stable Cardiovascular status: stable Anesthetic complications: no   No complications documented.   Last Vitals:  Vitals:   04/05/20 1414 04/05/20 1420  BP: (!) 178/89 (!) 188/82  Pulse: 92 87  Resp: 17 16  Temp: (!) 36.2 C (!) 36.4 C  SpO2: 98% 98%    Last Pain:  Vitals:   04/05/20 1420  TempSrc: Oral  PainSc: 0-No pain                 Beyonca Wisz K

## 2020-04-05 NOTE — Anesthesia Preprocedure Evaluation (Signed)
Anesthesia Evaluation  Patient identified by MRN, date of birth, ID band Patient awake    Reviewed: Allergy & Precautions, NPO status , Patient's Chart, lab work & pertinent test results  History of Anesthesia Complications Negative for: history of anesthetic complications  Airway Mallampati: III       Dental  (+) Missing, Chipped, Poor Dentition, Loose   Pulmonary COPD,  COPD inhaler, former smoker,           Cardiovascular hypertension, Pt. on medications + Past MI and + Cardiac Stents  + dysrhythmias      Neuro/Psych neg Seizures    GI/Hepatic Neg liver ROS, GERD  Medicated and Controlled,  Endo/Other  diabetes, Type 2, Oral Hypoglycemic Agents  Renal/GU negative Renal ROS     Musculoskeletal   Abdominal   Peds  Hematology   Anesthesia Other Findings   Reproductive/Obstetrics                             Anesthesia Physical Anesthesia Plan  ASA: III  Anesthesia Plan: General   Post-op Pain Management:    Induction: Intravenous  PONV Risk Score and Plan: 2 and Ondansetron and Dexamethasone  Airway Management Planned: LMA  Additional Equipment:   Intra-op Plan:   Post-operative Plan:   Informed Consent: I have reviewed the patients History and Physical, chart, labs and discussed the procedure including the risks, benefits and alternatives for the proposed anesthesia with the patient or authorized representative who has indicated his/her understanding and acceptance.       Plan Discussed with:   Anesthesia Plan Comments:         Anesthesia Quick Evaluation

## 2020-04-05 NOTE — Anesthesia Procedure Notes (Signed)
Procedure Name: LMA Insertion Date/Time: 04/05/2020 12:28 PM Performed by: Clyde Lundborg, CRNA Pre-anesthesia Checklist: Patient identified, Emergency Drugs available, Suction available and Patient being monitored Patient Re-evaluated:Patient Re-evaluated prior to induction Oxygen Delivery Method: Circle system utilized Preoxygenation: Pre-oxygenation with 100% oxygen Induction Type: IV induction Ventilation: Mask ventilation without difficulty LMA Size: 4.0 Tube type: Oral Number of attempts: 1 Placement Confirmation: positive ETCO2,  breath sounds checked- equal and bilateral and CO2 detector Tube secured with: Tape Dental Injury: Teeth and Oropharynx as per pre-operative assessment

## 2020-04-05 NOTE — H&P (Signed)
History of Present Illness: Gregory Mendoza is a 75 y.o. male who presents today for evaluation of painful catching and locking of his right middle finger. The patient notes that his symptoms have been ongoing for the past month, he denies any injury or trauma affecting the right hand. He is right-hand dominant. He is status post a left middle finger trigger finger release in the past in addition to his right thumb. The patient does also report some numbness and tingling in his right hand. The tingling he experiences in his fingers, he experiences this in the first 3 digits of the right hand. The symptoms are primarily at night, he states that during the night he will wake up in an attempt to "shake out his hand". He denies any history of carpal tunnel release in the past. He states that the symptoms have been going on for "a while". The patient denies any personal history of stroke, asthma, COPD or blood clot. The patient does have a history of heart attack, he does take Plavix.  Past Medical History: . Back pain  . COPD (chronic obstructive pulmonary disease) (CMS-HCC)  . Diabetes mellitus type 2, uncomplicated (CMS-HCC)  . Heart attack (CMS-HCC) x2  . Heart disease (Heart attack x2)  . Hyperlipidemia  . Hypertension  . Ulcer   Past Surgical History: . Limited arthroscopic debridement, arthroscopic subacromial decompression, and mini-open biceps tenodesis, right shoulder. Right 04/19/2019 (Dr.Xsavier Seeley)  . BACK SURGERY 1997 (LUMBAR L4-L5 DISKECTOMY)  . Cardiac surgery, catheterization  . COLONOSCOPY 12/19/2013 (No Polyps - repeat 10 years per Dr. Shelle Iron)  . HERNIA REPAIR (Right inguinal hernia repair)  . Right Trigger Thumb Release 08/17/13 (Dr Rosita Kea)   Past Family History: . Stroke Mother  . Lung cancer Father  . Heart disease Father   Medications: . ACCU-CHEK AVIVA PLUS METER Misc USE AS DIRECTED 1 each 0  . acetaminophen (TYLENOL) 500 MG tablet Take 500 mg by mouth as needed for Pain  .  alcohol swabs (BD ALCOHOL SWABS) PadM Apply 1 each topically 3 (three) times a day 300 each 1  . aspirin 81 MG chewable tablet Take 81 mg by mouth once daily.  Marland Kitchen atorvastatin (LIPITOR) 10 MG tablet Take 1 tablet (10 mg total) by mouth once daily 90 tablet 3  . blood glucose diagnostic (TRUE METRIX GLUCOSE TEST STRIP) test strip 1 each (1 strip total) by XX route 3 (three) times daily Use as instructed. 300 each 1  . clopidogreL (PLAVIX) 75 mg tablet TAKE 1 TABLET ONE TIME DAILY 90 tablet 3  . clotrimazole-betamethasone (LOTRISONE) 1-0.05 % cream Apply topically 2 (two) times daily 15 g 0  . gabapentin (NEURONTIN) 600 MG tablet Take 1 tablet (600 mg total) by mouth 3 (three) times daily 270 tablet 3  . glimepiride (AMARYL) 4 MG tablet TAKE 1 TABLET EVERY DAY WITH BREAKFAST 90 tablet 3  . insulin NPH (NOVOLIN N NPH U-100 INSULIN) injection (concentration 100 units/mL) Inject 10 Units subcutaneously once daily 10 mL 1  . insulin syringe-needle U-100 1/2 mL 31 gauge x 15/64" Syrg Use 1 Syringe 2 (two) times daily 200 each 3  . isosorbide mononitrate (IMDUR) 30 MG ER tablet Take 1 tablet (30 mg total) by mouth once daily 90 tablet 3  . lancets 33 gauge Misc Use 1 each 3 (three) times a day 100 each 5  . lisinopriL (ZESTRIL) 10 MG tablet Take 1 tablet (10 mg total) by mouth once daily 90 tablet 3  . metFORMIN (GLUCOPHAGE) 1000  MG tablet TAKE 1 TABLET (1,000 MG TOTAL) BY MOUTH 2 (TWO) TIMES DAILY WITH MEALS 180 tablet 3  . metoprolol tartrate (LOPRESSOR) 50 MG tablet TAKE 1 TABLET TWICE DAILY (NEED MD APPOINTMENT FOR REFILLS) 180 tablet 3  . pioglitazone (ACTOS) 15 MG tablet TAKE 1 TABLET EVERY DAY (DOSE DECREASE) 90 tablet 1  . TRUE METRIX LEVEL 1 Soln Use 1 each once a week 3 each 1  . diclofenac (VOLTAREN) 1 % topical gel Apply 4 g topically 4 (four) times daily 200 g 11  . nitroGLYcerin (NITROSTAT) 0.3 MG SL tablet Place 1 tablet (0.3 mg total) under the tongue every 5 (five) minutes as needed for  Chest pain May take up to 3 doses. 25 tablet 11  . traMADoL (ULTRAM) 50 mg tablet Take 1 tablet (50 mg total) by mouth 2 (two) times daily as needed 1 po bid prn (Patient not taking: Reported on 03/23/2020 ) 45 tablet 0   Allergies: No Known Allergies   Review of Systems:  A comprehensive 14 point ROS was performed, reviewed by me today, and the pertinent orthopaedic findings are documented in the HPI.  Physical Exam: BP (!) 140/90 (BP Location: Left upper arm, Patient Position: Sitting, BP Cuff Size: Adult)  General/Constitutional: The patient appears to be well-nourished, well-developed, and in no acute distress. Neuro/Psych: Normal mood and affect, oriented to person, place and time. Eyes: Non-icteric. Pupils are equal, round, and reactive to light, and exhibit synchronous movement. ENT: Unremarkable. Lymphatic: No palpable adenopathy. Respiratory: Lungs clear to auscultation, Normal chest excursion, No wheezes and Non-labored breathing Cardiovascular: Regular rate and rhythm. No murmurs. and No edema, swelling or tenderness, except as noted in detailed exam. Integumentary: No impressive skin lesions present, except as noted in detailed exam. Musculoskeletal: Unremarkable, except as noted in detailed exam.  Neck: Neck has full range of motion. There is no tenderness to palpation. Spurling's test is negative.   Right Upper Extremity: Normal shoulder contour. Good active and passive range of motion and stability of the shoulder, elbow, and wrist. The patient does have increased stiffness when attempting to flex the right middle finger, occasional triggering is noted. The patient is tender to palpation at the base of the right middle finger, and painful nodule can be palpated. No swelling, erythema, or ecchymosis is noted. No thenar atrophy is visualized. No intrinsic wasting. The patient has a positive Phalen's test. The patient has a positive Tinel's test. The patient has decreased pinprick  and light touch sensation in the median nerve distribution. There is normal grip strength and pincer strength. The patient has less than 2 second capillary refill with good skin warmth. Normal radial and ulnar pulse is palpated.   Neurologic: Sensory function is intact, except as noted above. Motor strength is 5/5, except as noted above. No tremor or clonus is present. Good motor coordination is noted.   Imaging: None.  Impression: 1. Trigger middle finger of right hand. 2. Carpal tunnel syndrome of right wrist. 3. Controlled type 2 diabetes mellitus with complication, with long-term current use of insulin.  Plan:  1. Treatment options were discussed today with the patient. 2. The patient is experiencing symptoms from both the right middle finger trigger finger in addition to carpal tunnel syndrome. 3. Discussed both conservative and more aggressive treatment options with the patient. The patient is quite frustrated by his continued symptoms. 4. The patient was instructed on the risk and benefits of a right endoscopic carpal tunnel release in addition to trigger  finger release procedure. The patient verbalizes his understanding and wishes to proceed. This will be scheduled Dr. Joice Lofts. 5. This document will serve as a surgical history and physical for the patient. 6. The patient will follow-up per standard post-op protocol. They can call the clinic they have any questions, new symptoms develop or symptoms worsen.  The procedure was discussed with the patient, as were the potential risks (including bleeding, infection, nerve and/or blood vessel injury, persistent or recurrent pain, failure of the release, progression of arthritis, need for further surgery, blood clots, strokes, heart attacks and/or arhythmias, pneumonia, etc.) and benefits. The patient states his understanding and wishes to proceed.   H&P reviewed and patient re-examined. No changes.

## 2020-04-05 NOTE — Op Note (Signed)
04/05/2020  1:28 PM  Patient:   Gregory Mendoza  Pre-Op Diagnosis:   1. Right carpal tunnel syndrome.  2. Right long trigger finger.  Post-Op Diagnosis:   Same.  Procedure:   1. Endoscopic right carpal tunnel release.  2. Release right long trigger finger.  Surgeon:   Maryagnes Amos, MD  Anesthesia:   General LMA  Findings:   As above.  Complications:   None  EBL:   1 cc  Fluids:   700 cc crystalloid  TT:   25 minutes at 250 mmHg  Drains:   None  Closure:   4-0 Prolene interrupted sutures  Brief Clinical Note:   The patient is a 75 year old male with a history of progressive worsening pain and paresthesias to his right hand. His symptoms have progressed despite medications, activity modification, etc. His history and examination are consistent with right carpal tunnel syndrome. The patient presents at this time for an endoscopic right carpal tunnel release.  The patient also notes a history of recurrent painful catching of his right long finger. His symptoms have persisted despite medications, activity modification, etc. His history and examination are consistent with a right long trigger finger. He presents at this time for release of the right long trigger finger as well.  Procedure:   The patient was brought into the operating room and lain in the supine position. After adequate general laryngeal mask anesthesia was obtained, the right hand and upper extremity were prepped with ChloraPrep solution before being draped sterilely. Preoperative antibiotics were administered. A timeout was performed to verify the appropriate surgical site before the limb was exsanguinated with an Esmarch and the tourniquet inflated to 250 mmHg. An approximately 1.5-2 cm incision was made over the volar wrist flexion crease, centered over the palmaris longus tendon. The incision was carried down through the subcutaneous tissues with care taken to identify and protect any neurovascular structures. The  distal forearm fascia was penetrated just proximal to the transverse carpal ligament. The soft tissues were released off the superficial and deep surfaces of the distal forearm fascia and this was released proximally for 3-4 cm under direct visualization.  Attention was directed distally. The Therapist, nutritional was passed beneath the transverse carpal ligament along the ulnar aspect of the carpal tunnel and used to release any adhesions as well as to remove any adherent synovial tissue before first the smaller then the larger of the two dilators were passed beneath the transverse carpal ligament along the ulnar margin of the carpal tunnel. The slotted cannula was introduced and the endoscope was placed into the slotted cannula and the undersurface of the transverse carpal ligament visualized. The distal margin of the transverse carpal ligament was marked by placing a 25-gauge needle percutaneously at Kaplan's cardinal point so that it entered the distal portion of the slotted cannula. Under endoscopic visualization, the transverse carpal ligament was released from proximal to distal using the end-cutting blade. A second pass was performed to ensure complete release of the ligament. The adequacy of release was verified both endoscopically and by palpation using the freer elevator.  Next, the right long trigger finger was addressed. An approximately 1.5-2.0 cm incision was made over the volar aspect of the right long finger at the level of the metacarpal head centered over the flexor sheath. The incision was carried down through the subcutaneous tissues with care taken to identify and protect the digital neurovascular structures. The flexor sheath was entered just proximal to the A1 pulley. The sheath  was released proximally for several centimeters under direct visualization. Distally, a clamp was placed beneath the A1 pulley and used to release any adhesions. The clamp was repositioned so that one jaw was superficial  to and the other jaw deep to the A1 pulley. The A1 pulley was incised on either side of the clamp to remove a 2 mm strip of tissue. Metzenbaum scissors were used to ensure complete release of the A1 pulley more distally. The underlying tendons were carefully inspected and found to be intact.  Each wound was irrigated thoroughly with sterile saline solution before being closed using 4-0 Prolene interrupted sutures. A total of 15 cc of 0.5% plain Sensorcaine was injected in and around the incision before a sterile bulky dressing was applied to the wounds. The patient was placed into a volar wrist splint before being awakened, extubated, and returned to the recovery room in satisfactory condition after tolerating the procedure well.

## 2020-04-05 NOTE — Discharge Instructions (Addendum)
AMBULATORY SURGERY  DISCHARGE INSTRUCTIONS   1) The drugs that you were given will stay in your system until tomorrow so for the next 24 hours you should not:  A) Drive an automobile B) Make any legal decisions C) Drink any alcoholic beverage   2) You may resume regular meals tomorrow.  Today it is better to start with liquids and gradually work up to solid foods.  You may eat anything you prefer, but it is better to start with liquids, then soup and crackers, and gradually work up to solid foods.   3) Please notify your doctor immediately if you have any unusual bleeding, trouble breathing, redness and pain at the surgery site, drainage, fever, or pain not relieved by medication. 4)   5) Additional Instructions:  Hospital main number [phone]     Orthopedic discharge instructions: Keep dressing dry and intact. Keep hand elevated above heart level. May shower after dressing removed on postop day 4 (Monday). Cover sutures with Band-Aids after drying off. Apply ice to affected area frequently. Take ibuprofen 600-800 mg TID with meals for 7-10 days, then as necessary. Take ES Tylenol or pain medication as prescribed when needed.  Return for follow-up in 10-14 days or as scheduled.

## 2020-04-05 NOTE — Transfer of Care (Signed)
Immediate Anesthesia Transfer of Care Note  Patient: Gregory Mendoza  Procedure(s) Performed: CARPAL TUNNEL RELEASE ENDOSCOPIC (Right Wrist) RIGHT MIDDLE FINGER TRIGGER RELEASE (Right Middle Finger)  Patient Location: PACU  Anesthesia Type:General  Level of Consciousness: awake and alert   Airway & Oxygen Therapy: Patient Spontanous Breathing and Patient connected to face mask oxygen  Post-op Assessment: Report given to RN and Post -op Vital signs reviewed and stable  Post vital signs: Reviewed and stable  Last Vitals:  Vitals Value Taken Time  BP    Temp    Pulse    Resp    SpO2      Last Pain:  Vitals:   04/05/20 1120  TempSrc: Oral         Complications: No complications documented.

## 2020-04-06 ENCOUNTER — Encounter: Payer: Self-pay | Admitting: Surgery

## 2020-05-01 DIAGNOSIS — H2513 Age-related nuclear cataract, bilateral: Secondary | ICD-10-CM | POA: Diagnosis not present

## 2020-05-01 DIAGNOSIS — E109 Type 1 diabetes mellitus without complications: Secondary | ICD-10-CM | POA: Diagnosis not present

## 2020-05-01 DIAGNOSIS — H52223 Regular astigmatism, bilateral: Secondary | ICD-10-CM | POA: Diagnosis not present

## 2020-05-01 DIAGNOSIS — H524 Presbyopia: Secondary | ICD-10-CM | POA: Diagnosis not present

## 2020-05-01 DIAGNOSIS — Z794 Long term (current) use of insulin: Secondary | ICD-10-CM | POA: Diagnosis not present

## 2020-05-01 DIAGNOSIS — Z0100 Encounter for examination of eyes and vision without abnormal findings: Secondary | ICD-10-CM | POA: Diagnosis not present

## 2020-05-01 DIAGNOSIS — H5203 Hypermetropia, bilateral: Secondary | ICD-10-CM | POA: Diagnosis not present

## 2020-05-31 ENCOUNTER — Ambulatory Visit: Payer: Medicare HMO | Attending: Surgery | Admitting: Occupational Therapy

## 2020-05-31 ENCOUNTER — Other Ambulatory Visit: Payer: Self-pay

## 2020-05-31 DIAGNOSIS — M25641 Stiffness of right hand, not elsewhere classified: Secondary | ICD-10-CM | POA: Insufficient documentation

## 2020-05-31 DIAGNOSIS — M79641 Pain in right hand: Secondary | ICD-10-CM | POA: Insufficient documentation

## 2020-05-31 DIAGNOSIS — L905 Scar conditions and fibrosis of skin: Secondary | ICD-10-CM | POA: Insufficient documentation

## 2020-05-31 DIAGNOSIS — M6281 Muscle weakness (generalized): Secondary | ICD-10-CM | POA: Diagnosis not present

## 2020-05-31 NOTE — Therapy (Signed)
[facility] [address], [place], [address] Phone: [phone]   Fax:  [phone]  Occupational Therapy Evaluation  Patient Details  Name: Gregory Mendoza MRN: [id number] Date of Birth: 1945-12-30 Referring Provider (OT): DR Poggy   Encounter Date: 05/31/2020   OT End of Session - 05/31/20 1107    Visit Number 1    Number of Visits 12    Date for OT Re-Evaluation 07/12/20    OT Start Time 0940    OT Stop Time 1030    OT Time Calculation (min) 50 min    Behavior During Therapy [facility] for tasks assessed/performed           Past Medical History:  Diagnosis Date  . Arthritis    NECK  . Chronic back pain   . COPD (chronic obstructive pulmonary disease) (HCC)   . Diabetes mellitus without complication (HCC)   . Dyspnea    OCC  . Dysrhythmia   . GERD (gastroesophageal reflux disease)   . Hyperlipidemia   . Hypertension   . Myocardial infarction (HCC) 2007, 2009, 2020  . Peripheral neuropathy   . Symptomatic PVCs   . Thrombosis    IN STENT AFTER MI    Past Surgical History:  Procedure Laterality Date  . APPENDECTOMY  1970's  . BACK SURGERY  1997   Discectomy L4-L5  . CARDIAC CATHETERIZATION    . CARPAL TUNNEL RELEASE Right 04/05/2020   Procedure: CARPAL TUNNEL RELEASE ENDOSCOPIC;  Surgeon: Christena Flake, MD;  Location: [facility] ORS;  Service: Orthopedics;  Laterality: Right;  . COLONOSCOPY  2017  . CORONARY STENT INTERVENTION N/A 12/14/2017   Procedure: CORONARY STENT INTERVENTION;  Surgeon: Alwyn Pea, MD;  Location: [facility] IN[facility]IVE [facility];  Service: Cardiovascular;  Laterality: N/A;  . CORONARY/GRAFT ACUTE MI REVASCULARIZATION N/A 12/18/2017   Procedure: Coronary/Graft Acute MI Revascularization;  Surgeon: Alwyn Pea, MD;  Location: [facility] IN[facility] CV [facility];  Service: Cardiovascular;  Laterality: N/A;  . HERNIA REPAIR Left 02/08/2008   Dr Lemar Livings  . INGUINAL HERNIA REPAIR Right 1999   with  mesh while in [place]  . LEFT HEART CATH AND CORONARY ANGIOGRAPHY N/A 12/14/2017   Procedure: LEFT HEART CATH AND CORONARY ANGIOGRAPHY;  Surgeon: Lamar Blinks, MD;  Location: [facility] IN[facility];  Service: Cardiovascular;  Laterality: N/A;  . LEFT HEART CATH AND CORONARY ANGIOGRAPHY N/A 12/18/2017   Procedure: LEFT HEART CATH AND CORONARY ANGIOGRAPHY;  Surgeon: Alwyn Pea, MD;  Location: [facility] IN[facility];  Service: Cardiovascular;  Laterality: N/A;  . SHOULDER ARTHROSCOPY WITH ROTATOR CUFF REPAIR AND OPEN BICEPS TENODESIS Right 04/19/2019   Procedure: RIGHT SHOULDER ARTHROSCOPY WITH DEBRIDEMENT, DECOMPRESSION, ROTATOR CUFF REPAIR, AND POSSIBLE BICEPS TENODESIS.;  Surgeon: Christena Flake, MD;  Location: [facility];  Service: Orthopedics;  Laterality: Right;  . THROMBECTOMY     IN STENT   . TRIGGER FINGER RELEASE Right 04/05/2020   Procedure: RIGHT MIDDLE FINGER TRIGGER RELEASE;  Surgeon: Christena Flake, MD;  Location: [facility] ORS;  Service: Orthopedics;  Laterality: Right;  . trigger thumb Right     There were no vitals filed for this visit.   Subjective Assessment - 05/31/20 1220    Subjective  My wrist feels good- but my middle finger hurts so bad - when I open it, or try and straighten it - and cannot make fist - hurts when making fist- cannot grip things, can't put my hand in pocket  Pertinent History Report CTS for while and trigger finger for about month prior to surgery- but it locked down most of the time- DR Poggi done arthroscopy CTR and 3rd digit trigger finger release on 04/05/20 and then had stitches removed on 05/21/20 - but cannot extend or flex fingers and some of the other fingers are getting stiff- middle finger painfull and edema - and flexor contracture - refer to OT/hand therapy    Patient Stated Goals I want my hand and middle finger better - and the pain and sweeling -so I can work in yard, put glove on and put my hand in my pocket    Currently in Pain? Yes    Pain  Score 2    8/10 with use or moving   Pain Location Finger (Comment which one)    Pain Orientation Right    Pain Descriptors / Indicators Aching;Tender;Tightness;Sharp;Shooting    Pain Type Surgical pain    Pain Onset More than a month ago    Pain Frequency Intermittent             [facility] OT Assessment - 05/31/20 0001      Assessment   Medical Diagnosis R CTR and 3rd trigger finger release    Referring Provider (OT) DR Poggy    Onset Date/Surgical Date 04/05/20    Hand Dominance Right    Next MD Visit may 9th      Home  Environment   Lives With Spouse      Prior Function   Vocation Retired    Designer, multimedia, very active      Right Hand AROM   R Thumb Opposition to Index --   Oppostion WNL   R Index  MCP 0-90 85 Degrees    R Index PIP 0-100 100 Degrees    R Long  MCP 0-90 75 Degrees    R Long PIP 0-100 85 Degrees   -35   R Ring  MCP 0-90 80 Degrees    R Ring PIP 0-100 100 Degrees    R Little  MCP 0-90 85 Degrees    R Little PIP 0-100 100 Degrees              Contrast done- and fit and ed on use of LMB splint on 3rd PIP - 2nd and 3rd rotation heat  light stretch less than 2/10  Scar massage and soft tissue massage volar 3rd digit AAROM on table for PIP and 3rd digit extention - 10 reps Pain free  tendon glides -AROM block - pain less than 2/10  3 x day And fabricated serial cast for night time - done for 3rd digit PIP - at -10 degrees   tolerate well  Pt to enlarge grip and use larger joint , palm or forearm to carry or use hand                OT Education - 05/31/20 1053    Education Details Findings of eval and HEP    Person(s) Educated Patient    Methods Explanation;Demonstration;Verbal cues;Tactile cues;Handout    Comprehension Verbal cues required;Returned demonstration;Verbalized understanding            OT Short Term Goals - 05/31/20 1206      OT SHORT TERM GOAL #1   Title Pt to be independent in HEP to increase decrease pain and  decrease edema to show increase 3rd digit extention and flexion    Baseline pain with extention and flexion increase 8/10 ,  extention of PIP coming in -35, and flexion MC 75 and PIP 85 - edema in 3rd digit increase by 0.5 cm    Time 2    Period Weeks    Status New    Target Date 06/14/20             OT Long Term Goals - 05/31/20 1207      OT LONG TERM GOAL #1   Title Pain on PRWHE improve with more than 15 points    Baseline at eval PRWHE pain 28/50    Time 4    Period Weeks    Status New    Target Date 06/28/20      OT LONG TERM GOAL #2   Title R hand AROM in 3rd digit improve for pt to have full extention of 3rd digit to put hand in pocket and donn gloves    Baseline PIP extention -35 this date coming in    Time 5    Period Weeks    Status New    Target Date 07/05/20      OT LONG TERM GOAL #3   Title L hand digits flexion increase to touch palm without increase symptoms to carry more than 10 lbs without pain and grip tools    Baseline flexion of 3rd 75 MC, PIP 85 and pain increase with AROM fist 8/10 and use - PRWHE 28/50    Time 6    Period Weeks    Status New    Target Date 07/12/20                 Plan - 05/31/20 1201    Clinical Impression Statement Pt present at OT eval 8 wks s/p R  arthroscopic CTR and 3rd digit trigger finger relase - pt with increase pain, edema and stiffness in R hand and 3rd digit- scar tissue thickening in palm - with flexor contracture coming in of -35 in 3rd PIP , and flexion [facility] at [facility] , and PIP 85- also stiffness in other digits - pain at rest about 1/10 and with use, gripping, pushing or attempting to straighten 3rd digit - increase to 8/10 - pt limited in functional use of R dominant hand in ADL's and IADL's    OT Occupational Profile and History Problem Focused Assessment - Including review of records relating to presenting problem    Occupational performance deficits (Please refer to evaluation for details):  ADL's;IADL's;Play;Leisure;Social Participation;Rest and Sleep    Body Structure / Function / Physical Skills ADL;Decreased knowledge of precautions;Flexibility;FMC;Edema;Dexterity;Strength;Pain;IADL;ROM;Scar mobility    Rehab Potential Good    Clinical Decision Making Limited treatment options, no task modification necessary    Comorbidities Affecting Occupational Performance: None    Modification or Assistance to Complete Evaluation  Min-Moderate modification of tasks or assist with assess necessary to complete eval   Diabetic   OT Frequency 2x / week   decrease as progress   OT Duration 6 weeks    OT Treatment/Interventions Self-care/ADL training;Paraffin;Ultrasound;Contrast Bath;Fluidtherapy;Therapeutic exercise;Manual Therapy;Passive range of motion;Scar mobilization;DME and/or AE instruction;Splinting;Patient/family education    Consulted and Agree with Plan of Care Patient           Patient will benefit from skilled therapeutic intervention in order to improve the following deficits and impairments:   Body Structure / Function / Physical Skills: ADL,Decreased knowledge of precautions,Flexibility,FMC,Edema,Dexterity,Strength,Pain,IADL,ROM,Scar mobility       Visit Diagnosis: Scar condition and fibrosis of skin - Plan: Ot plan of care cert/re-cert  Pain  in right hand - Plan: Ot plan of care cert/re-cert  Stiffness of right hand, not elsewhere classified - Plan: Ot plan of care cert/re-cert  Muscle weakness (generalized) - Plan: Ot plan of care cert/re-cert    Problem List Patient Active Problem List   Diagnosis Date Noted  . Abnormal MRI, cervical spine (09/21/2019) 09/28/2019  . Cervical facet hypertrophy (Multilevel) (C2-C7) (Bilateral) 09/28/2019  . Cervical facet syndrome (Multilevel) (Bilateral) 09/28/2019  . Cervical spondylosis with radiculopathy (C6) (Right) 09/28/2019  . Arthropathy of shoulder (Left) 09/28/2019  . Arthropathy of shoulder (Right) 09/28/2019  .  Arthropathy of shoulders (Bilateral) 09/28/2019  . Abnormal MRI, shoulder (Bilateral) (04/02/2018) 09/28/2019  . Degenerative tear of glenoid labrum of shoulder (Right) 09/21/2019  . Cervical disc disorder with radiculopathy of cervical region (Right) 09/05/2019  . Cervical radiculopathy at C6 (Right) 09/05/2019  . Chronic shoulder pain (2ry area of Pain) (Bilateral) (L>R) 09/05/2019  . Cervicalgia 09/05/2019  . DDD (degenerative disc disease), cervical 09/05/2019  . Decreased range of motion of intervertebral discs of cervical spine 09/05/2019  . Impaired range of motion of both shoulders 09/05/2019  . Chronic neck pain (1ry area of Pain) (posterior) (Bilateral) (L>R) 09/05/2019  . Chronic shoulder radicular pain (Bilateral) 09/05/2019  . Chronic anticoagulation (Plavix) 09/05/2019  . Cervical Grade 1 (2 mm) Anterolisthesis of C5/C6 09/05/2019  . Cervical foraminal stenosis 09/05/2019  . Cervical neural foraminal stenosis 09/05/2019  . Chronic pain syndrome 09/04/2019  . Pharmacologic therapy 09/04/2019  . Disorder of skeletal system 09/04/2019  . Problems influencing health status 09/04/2019  . Chronic back pain   . COPD (chronic obstructive pulmonary disease) (HCC)   . Peripheral neuropathy   . Tendinitis of upper biceps tendon of right shoulder 04/22/2019  . Nontraumatic incomplete tear of rotator cuff (Left) 04/26/2018  . Rotator cuff tendinitis (Left) 04/26/2018  . Rotator cuff tendinitis (Right) 04/26/2018  . STEMI involving right coronary artery (HCC) 12/18/2017  . NSTEMI (non-ST elevated myocardial infarction) (HCC) 12/13/2017  . Chest pain 12/12/2017  . Groin pain, right 03/04/2016  . Health care maintenance 06/07/2015  . Cardiac syncope 10/10/2014  . Chronic sciatica (Right) 04/04/2014  . CAD (coronary artery disease), native coronary artery 11/12/2013  . COPD (chronic obstructive pulmonary disease) with chronic bronchitis (HCC) 11/12/2013  . DM II (diabetes mellitus,  type II), controlled (HCC) 11/12/2013  . HTN (hypertension), benign 11/12/2013    Oletta Cohn OTR/L,CLT 05/31/2020, 12:28 PM  [facility] [address], [place], [address] Phone: [phone]   Fax:  [phone]  Name: Hasani Diemer MRN: [id number] Date of Birth: May 12, 1945

## 2020-05-31 NOTE — Patient Instructions (Signed)
Contrast done- and fit and ed on use of LMB splint on 3rd PIP - 2nd and 3rd rotation heat  light stretch less than 2/10  Scar massage and soft tissue massage volar 3rd digit AAROM on table for PIP and 3rd digit extention - 10 reps Pain free  tendon glides -AROM block - pain less than 2/10  3 x day And fabricated serial cast for night time - done for 3rd digit PIP - at -10 degrees   tolerate well  Pt to enlarge grip and use larger joint , palm or forearm to carry or use hand

## 2020-06-05 ENCOUNTER — Other Ambulatory Visit: Payer: Self-pay

## 2020-06-05 ENCOUNTER — Ambulatory Visit: Payer: Medicare HMO | Admitting: Occupational Therapy

## 2020-06-05 DIAGNOSIS — M6281 Muscle weakness (generalized): Secondary | ICD-10-CM

## 2020-06-05 DIAGNOSIS — M25641 Stiffness of right hand, not elsewhere classified: Secondary | ICD-10-CM | POA: Diagnosis not present

## 2020-06-05 DIAGNOSIS — M79641 Pain in right hand: Secondary | ICD-10-CM | POA: Diagnosis not present

## 2020-06-05 DIAGNOSIS — L905 Scar conditions and fibrosis of skin: Secondary | ICD-10-CM

## 2020-06-05 NOTE — Therapy (Signed)
[facility] [address], [place], [address] Phone: [phone]   Fax:  [phone]  Occupational Therapy Treatment  Patient Details  Name: Gregory Mendoza MRN: [id number] Date of Birth: 10/22/1945 Referring Provider (OT): DR Poggy   Encounter Date: 06/05/2020   OT End of Session - 06/05/20 1728    Visit Number 2    Number of Visits 12    Date for OT Re-Evaluation 07/12/20    OT Start Time 1700    OT Stop Time 1754    OT Time Calculation (min) 54 min    Activity Tolerance Patient tolerated treatment well    Behavior During Therapy [facility] for tasks assessed/performed           Past Medical History:  Diagnosis Date  . Arthritis    NECK  . Chronic back pain   . COPD (chronic obstructive pulmonary disease) (HCC)   . Diabetes mellitus without complication (HCC)   . Dyspnea    OCC  . Dysrhythmia   . GERD (gastroesophageal reflux disease)   . Hyperlipidemia   . Hypertension   . Myocardial infarction (HCC) 2007, 2009, 2020  . Peripheral neuropathy   . Symptomatic PVCs   . Thrombosis    IN STENT AFTER MI    Past Surgical History:  Procedure Laterality Date  . APPENDECTOMY  1970's  . BACK SURGERY  1997   Discectomy L4-L5  . CARDIAC CATHETERIZATION    . CARPAL TUNNEL RELEASE Right 04/05/2020   Procedure: CARPAL TUNNEL RELEASE ENDOSCOPIC;  Surgeon: Christena Flake, MD;  Location: [facility] ORS;  Service: Orthopedics;  Laterality: Right;  . COLONOSCOPY  2017  . CORONARY STENT INTERVENTION N/A 12/14/2017   Procedure: CORONARY STENT INTERVENTION;  Surgeon: Alwyn Pea, MD;  Location: [facility] INVASIVE [facility];  Service: Cardiovascular;  Laterality: N/A;  . CORONARY/GRAFT ACUTE MI REVASCULARIZATION N/A 12/18/2017   Procedure: Coronary/Graft Acute MI Revascularization;  Surgeon: Alwyn Pea, MD;  Location: [facility] INV[facility] CV LAB;  Service: Cardiovascular;  Laterality: N/A;  . HERNIA REPAIR Left 02/08/2008    Dr Lemar Livings  . INGUINAL HERNIA REPAIR Right 1999   with mesh while in [place]  . LEFT HEART CATH AND CORONARY ANGIOGRAPHY N/A 12/14/2017   Procedure: LEFT HEART CATH AND CORONARY ANGIOGRAPHY;  Surgeon: Lamar Blinks, MD;  Location: [facility] INV[facility] CV [facility];  Service: Cardiovascular;  Laterality: N/A;  . LEFT HEART CATH AND CORONARY ANGIOGRAPHY N/A 12/18/2017   Procedure: LEFT HEART CATH AND CORONARY ANGIOGRAPHY;  Surgeon: Alwyn Pea, MD;  Location: [facility] IN[facility] CV [facility];  Service: Cardiovascular;  Laterality: N/A;  . SHOULDER ARTHROSCOPY WITH ROTATOR CUFF REPAIR AND OPEN BICEPS TENODESIS Right 04/19/2019   Procedure: RIGHT SHOULDER ARTHROSCOPY WITH DEBRIDEMENT, DECOMPRESSION, ROTATOR CUFF REPAIR, AND POSSIBLE BICEPS TENODESIS.;  Surgeon: Christena Flake, MD;  Location: [facility] ORS;  Service: Orthopedics;  Laterality: Right;  . THROMBECTOMY     IN STENT   . TRIGGER FINGER RELEASE Right 04/05/2020   Procedure: RIGHT MIDDLE FINGER TRIGGER RELEASE;  Surgeon: Christena Flake, MD;  Location: [facility] ORS;  Service: Orthopedics;  Laterality: Right;  . trigger thumb Right     There were no vitals filed for this visit.   Subjective Assessment - 06/05/20 1722    Subjective  Doing okay if not moving - but if making fist or use my hand gripping - that middle joint goes to 7/10- my finger is straight in the am but then it  drops down -and if doing things during day it goes back down - I can get it straight with exercises too -but I have been working in the yard- and today sanding the doors in my kitchen    Pertinent History Report CTS for while and trigger finger for about month prior to surgery- but it locked down most of the time- DR Poggi done arthroscopy CTR and 3rd digit trigger finger release on 04/05/20 and then had stitches removed on 05/21/20 - but cannot extend or flex fingers and some of the other fingers are getting stiff- middle finger painfull and edema - and flexor contracture - refer to OT/hand therapy     Patient Stated Goals I want my hand and middle finger better - and the pain and sweeling -so I can work in yard, put glove on and put my hand in my pocket    Currently in Pain? Yes    Pain Score 7     Pain Location Finger (Comment which one)    Pain Orientation Right    Pain Descriptors / Indicators Aching;Tightness;Sore;Tender    Pain Type Surgical pain    Pain Onset More than a month ago    Pain Frequency Intermittent              [facility] OT Assessment - 06/05/20 0001      Right Hand AROM   R Index  MCP 0-90 85 Degrees    R Index PIP 0-100 100 Degrees    R Long  MCP 0-90 85 Degrees    R Long PIP 0-100 95 Degrees   -25 and in session -5   R Ring  MCP 0-90 85 Degrees    R Ring PIP 0-100 100 Degrees    R Little  MCP 0-90 85 Degrees    R Little PIP 0-100 95 Degrees              Pt coming in pain 7/10 at PIP of 3rd and over extensor tendon  tight scar tissue and thick ? some Dupuytren's fascia at palm       OT Treatments/Exercises (OP) - 06/05/20 0001      RUE Fluidotherapy   Number Minutes Fluidotherapy 10 Minutes    RUE Fluidotherapy Location Wrist;Hand    Comments prior to soft tissue , increase ROM - decrease pain ice 2 x 2 min inbetwen         pain decrease and AROM increase with less pain    Contrast to cont at home prior to HEP  -  and fit and ed at eval on use of LMB splint on 3rd PIP - 2nd and 3rd rotation heat with eval   light stretch less than 2/10   Scar massage and soft tissue massage volar 3rd digit and palm - mini massager and Graston tool nr 2 - brushing   AAROM on table for PIP and 3rd digit extention - 10 reps Pain free  tendon glides -AROM block - pain less than 2/10 - with using L hand swiping digits in extention - but composite fist to 2 fingers pain free-  3 x day And fabricated serial cast for night time at eval  - done for 3rd digit PIP - at -10 degrees   tolerate well and to cont with Reinforce for pt  to enlarge grip and use larger  joint , palm or forearm to carry or use hand         OT Education - 06/05/20  1727    Education Details progress and changes HEP    Person(s) Educated Patient    Methods Explanation;Demonstration;Verbal cues;Tactile cues;Handout    Comprehension Verbal cues required;Returned demonstration;Verbalized understanding            OT Short Term Goals - 05/31/20 1206      OT SHORT TERM GOAL #1   Title Pt to be independent in HEP to increase decrease pain and decrease edema to show increase 3rd digit extention and flexion    Baseline pain with extention and flexion increase 8/10 , extention of PIP coming in -35, and flexion MC 75 and PIP 85 - edema in 3rd digit increase by 0.5 cm    Time 2    Period Weeks    Status New    Target Date 06/14/20             OT Long Term Goals - 05/31/20 1207      OT LONG TERM GOAL #1   Title Pain on PRWHE improve with more than 15 points    Baseline at eval PRWHE pain 28/50    Time 4    Period Weeks    Status New    Target Date 06/28/20      OT LONG TERM GOAL #2   Title R hand AROM in 3rd digit improve for pt to have full extention of 3rd digit to put hand in pocket and donn gloves    Baseline PIP extention -35 this date coming in    Time 5    Period Weeks    Status New    Target Date 07/05/20      OT LONG TERM GOAL #3   Title L hand digits flexion increase to touch palm without increase symptoms to carry more than 10 lbs without pain and grip tools    Baseline flexion of 3rd 75 MC, PIP 85 and pain increase with AROM fist 8/10 and use - PRWHE 28/50    Time 6    Period Weeks    Status New    Target Date 07/12/20                 Plan - 06/05/20 1755    Clinical Impression Statement Pt present at OT eval 9  wks s/p R  arthroscopic CTR and 3rd digit trigger finger relase - pt with increase pain, edema and stiffness in R hand and 3rd digit coming in again this date- pt report sanding at home and working in yard -- scar tissue  thickening in palm - with flexor contracture coming in of -25 in 3rd PIP , and flexion did increase since last week in digits including 3rd - but reinforce with pt to avoid tight and sustained grip- need to adress scar tissue and extention of PIP  - pain at rest about 1/10 and with use, gripping, pushing or attempting to straighten 3rd digit - increase to 7/10 - pt limited in functional use of R dominant hand in ADL's and IADL's - pt can benefit from cont OT services    OT Occupational Profile and History Problem Focused Assessment - Including review of records relating to presenting problem    Occupational performance deficits (Please refer to evaluation for details): ADL's;IADL's;Play;Leisure;Social Participation;Rest and Sleep    Body Structure / Function / Physical Skills ADL;Decreased knowledge of precautions;Flexibility;FMC;Edema;Dexterity;Strength;Pain;IADL;ROM;Scar mobility    Rehab Potential Good    Clinical Decision Making Limited treatment options, no task modification necessary    Comorbidities Affecting  Occupational Performance: None    Modification or Assistance to Complete Evaluation  Min-Moderate modification of tasks or assist with assess necessary to complete eval    OT Frequency 2x / week    OT Duration 6 weeks    OT Treatment/Interventions Self-care/ADL training;Paraffin;Ultrasound;Contrast Bath;Fluidtherapy;Therapeutic exercise;Manual Therapy;Passive range of motion;Scar mobilization;DME and/or AE instruction;Splinting;Patient/family education    Consulted and Agree with Plan of Care Patient           Patient will benefit from skilled therapeutic intervention in order to improve the following deficits and impairments:   Body Structure / Function / Physical Skills: ADL,Decreased knowledge of precautions,Flexibility,FMC,Edema,Dexterity,Strength,Pain,IADL,ROM,Scar mobility       Visit Diagnosis: Scar condition and fibrosis of skin  Pain in right hand  Stiffness of right  hand, not elsewhere classified  Muscle weakness (generalized)    Problem List Patient Active Problem List   Diagnosis Date Noted  . Abnormal MRI, cervical spine (09/21/2019) 09/28/2019  . Cervical facet hypertrophy (Multilevel) (C2-C7) (Bilateral) 09/28/2019  . Cervical facet syndrome (Multilevel) (Bilateral) 09/28/2019  . Cervical spondylosis with radiculopathy (C6) (Right) 09/28/2019  . Arthropathy of shoulder (Left) 09/28/2019  . Arthropathy of shoulder (Right) 09/28/2019  . Arthropathy of shoulders (Bilateral) 09/28/2019  . Abnormal MRI, shoulder (Bilateral) (04/02/2018) 09/28/2019  . Degenerative tear of glenoid labrum of shoulder (Right) 09/21/2019  . Cervical disc disorder with radiculopathy of cervical region (Right) 09/05/2019  . Cervical radiculopathy at C6 (Right) 09/05/2019  . Chronic shoulder pain (2ry area of Pain) (Bilateral) (L>R) 09/05/2019  . Cervicalgia 09/05/2019  . DDD (degenerative disc disease), cervical 09/05/2019  . Decreased range of motion of intervertebral discs of cervical spine 09/05/2019  . Impaired range of motion of both shoulders 09/05/2019  . Chronic neck pain (1ry area of Pain) (posterior) (Bilateral) (L>R) 09/05/2019  . Chronic shoulder radicular pain (Bilateral) 09/05/2019  . Chronic anticoagulation (Plavix) 09/05/2019  . Cervical Grade 1 (2 mm) Anterolisthesis of C5/C6 09/05/2019  . Cervical foraminal stenosis 09/05/2019  . Cervical neural foraminal stenosis 09/05/2019  . Chronic pain syndrome 09/04/2019  . Pharmacologic therapy 09/04/2019  . Disorder of skeletal system 09/04/2019  . Problems influencing health status 09/04/2019  . Chronic back pain   . COPD (chronic obstructive pulmonary disease) (HCC)   . Peripheral neuropathy   . Tendinitis of upper biceps tendon of right shoulder 04/22/2019  . Nontraumatic incomplete tear of rotator cuff (Left) 04/26/2018  . Rotator cuff tendinitis (Left) 04/26/2018  . Rotator cuff tendinitis (Right)  04/26/2018  . STEMI involving right coronary artery (HCC) 12/18/2017  . NSTEMI (non-ST elevated myocardial infarction) (HCC) 12/13/2017  . Chest pain 12/12/2017  . Groin pain, right 03/04/2016  . Health care maintenance 06/07/2015  . Cardiac syncope 10/10/2014  . Chronic sciatica (Right) 04/04/2014  . CAD (coronary artery disease), native coronary artery 11/12/2013  . COPD (chronic obstructive pulmonary disease) with chronic bronchitis (HCC) 11/12/2013  . DM II (diabetes mellitus, type II), controlled (HCC) 11/12/2013  . HTN (hypertension), benign 11/12/2013    Oletta Cohn OTR/L,CLT 06/05/2020, 5:58 PM  [facility] SPORTS MEDICINE [address], [place], [address] Phone: [phone]   Fax:  [phone]  Name: Mylan Schwarz MRN: [id number] Date of Birth: 1945-12-27

## 2020-06-08 ENCOUNTER — Encounter: Payer: Medicare HMO | Admitting: Occupational Therapy

## 2020-06-11 ENCOUNTER — Encounter: Payer: Medicare HMO | Admitting: Occupational Therapy

## 2020-06-13 ENCOUNTER — Encounter: Payer: Medicare HMO | Admitting: Occupational Therapy

## 2020-08-01 DIAGNOSIS — Z794 Long term (current) use of insulin: Secondary | ICD-10-CM | POA: Diagnosis not present

## 2020-08-01 DIAGNOSIS — E118 Type 2 diabetes mellitus with unspecified complications: Secondary | ICD-10-CM | POA: Diagnosis not present

## 2020-08-01 DIAGNOSIS — I10 Essential (primary) hypertension: Secondary | ICD-10-CM | POA: Diagnosis not present

## 2020-08-08 DIAGNOSIS — E119 Type 2 diabetes mellitus without complications: Secondary | ICD-10-CM | POA: Diagnosis not present

## 2020-08-08 DIAGNOSIS — I10 Essential (primary) hypertension: Secondary | ICD-10-CM | POA: Diagnosis not present

## 2020-08-08 DIAGNOSIS — Z0000 Encounter for general adult medical examination without abnormal findings: Secondary | ICD-10-CM | POA: Diagnosis not present

## 2020-08-08 DIAGNOSIS — I2510 Atherosclerotic heart disease of native coronary artery without angina pectoris: Secondary | ICD-10-CM | POA: Diagnosis not present

## 2020-08-08 DIAGNOSIS — J449 Chronic obstructive pulmonary disease, unspecified: Secondary | ICD-10-CM | POA: Diagnosis not present

## 2020-08-14 DIAGNOSIS — M65331 Trigger finger, right middle finger: Secondary | ICD-10-CM | POA: Diagnosis not present

## 2020-11-30 DIAGNOSIS — E118 Type 2 diabetes mellitus with unspecified complications: Secondary | ICD-10-CM | POA: Diagnosis not present

## 2020-11-30 DIAGNOSIS — Z794 Long term (current) use of insulin: Secondary | ICD-10-CM | POA: Diagnosis not present

## 2020-11-30 DIAGNOSIS — I2510 Atherosclerotic heart disease of native coronary artery without angina pectoris: Secondary | ICD-10-CM | POA: Diagnosis not present

## 2020-12-07 DIAGNOSIS — E119 Type 2 diabetes mellitus without complications: Secondary | ICD-10-CM | POA: Diagnosis not present

## 2020-12-07 DIAGNOSIS — Z794 Long term (current) use of insulin: Secondary | ICD-10-CM | POA: Diagnosis not present

## 2020-12-07 DIAGNOSIS — I2510 Atherosclerotic heart disease of native coronary artery without angina pectoris: Secondary | ICD-10-CM | POA: Diagnosis not present

## 2020-12-07 DIAGNOSIS — J449 Chronic obstructive pulmonary disease, unspecified: Secondary | ICD-10-CM | POA: Diagnosis not present

## 2020-12-07 DIAGNOSIS — I10 Essential (primary) hypertension: Secondary | ICD-10-CM | POA: Diagnosis not present

## 2021-01-02 DIAGNOSIS — M7918 Myalgia, other site: Secondary | ICD-10-CM | POA: Diagnosis not present

## 2021-01-02 DIAGNOSIS — M542 Cervicalgia: Secondary | ICD-10-CM | POA: Diagnosis not present

## 2021-01-02 DIAGNOSIS — G8929 Other chronic pain: Secondary | ICD-10-CM | POA: Diagnosis not present

## 2021-01-02 DIAGNOSIS — M25512 Pain in left shoulder: Secondary | ICD-10-CM | POA: Diagnosis not present

## 2021-01-02 DIAGNOSIS — M47812 Spondylosis without myelopathy or radiculopathy, cervical region: Secondary | ICD-10-CM | POA: Diagnosis not present

## 2021-01-02 DIAGNOSIS — M67819 Other specified disorders of synovium and tendon, unspecified shoulder: Secondary | ICD-10-CM | POA: Diagnosis not present

## 2021-01-16 DIAGNOSIS — G8929 Other chronic pain: Secondary | ICD-10-CM | POA: Diagnosis not present

## 2021-01-16 DIAGNOSIS — M25512 Pain in left shoulder: Secondary | ICD-10-CM | POA: Diagnosis not present

## 2021-04-02 DIAGNOSIS — I10 Essential (primary) hypertension: Secondary | ICD-10-CM | POA: Diagnosis not present

## 2021-04-02 DIAGNOSIS — E118 Type 2 diabetes mellitus with unspecified complications: Secondary | ICD-10-CM | POA: Diagnosis not present

## 2021-04-02 DIAGNOSIS — I2510 Atherosclerotic heart disease of native coronary artery without angina pectoris: Secondary | ICD-10-CM | POA: Diagnosis not present

## 2021-04-02 DIAGNOSIS — Z794 Long term (current) use of insulin: Secondary | ICD-10-CM | POA: Diagnosis not present

## 2021-04-09 DIAGNOSIS — I10 Essential (primary) hypertension: Secondary | ICD-10-CM | POA: Diagnosis not present

## 2021-04-09 DIAGNOSIS — M159 Polyosteoarthritis, unspecified: Secondary | ICD-10-CM | POA: Diagnosis not present

## 2021-04-09 DIAGNOSIS — Z794 Long term (current) use of insulin: Secondary | ICD-10-CM | POA: Diagnosis not present

## 2021-04-09 DIAGNOSIS — E118 Type 2 diabetes mellitus with unspecified complications: Secondary | ICD-10-CM | POA: Diagnosis not present

## 2021-04-09 DIAGNOSIS — J449 Chronic obstructive pulmonary disease, unspecified: Secondary | ICD-10-CM | POA: Diagnosis not present

## 2021-04-09 DIAGNOSIS — I2510 Atherosclerotic heart disease of native coronary artery without angina pectoris: Secondary | ICD-10-CM | POA: Diagnosis not present

## 2021-07-09 DIAGNOSIS — E118 Type 2 diabetes mellitus with unspecified complications: Secondary | ICD-10-CM | POA: Diagnosis not present

## 2021-07-09 DIAGNOSIS — Z03818 Encounter for observation for suspected exposure to other biological agents ruled out: Secondary | ICD-10-CM | POA: Diagnosis not present

## 2021-07-09 DIAGNOSIS — R059 Cough, unspecified: Secondary | ICD-10-CM | POA: Diagnosis not present

## 2021-07-09 DIAGNOSIS — J449 Chronic obstructive pulmonary disease, unspecified: Secondary | ICD-10-CM | POA: Diagnosis not present

## 2021-07-09 DIAGNOSIS — Z794 Long term (current) use of insulin: Secondary | ICD-10-CM | POA: Diagnosis not present

## 2021-08-16 DIAGNOSIS — J449 Chronic obstructive pulmonary disease, unspecified: Secondary | ICD-10-CM | POA: Diagnosis not present

## 2021-08-16 DIAGNOSIS — I2510 Atherosclerotic heart disease of native coronary artery without angina pectoris: Secondary | ICD-10-CM | POA: Diagnosis not present

## 2021-08-16 DIAGNOSIS — Z1389 Encounter for screening for other disorder: Secondary | ICD-10-CM | POA: Diagnosis not present

## 2021-08-16 DIAGNOSIS — Z794 Long term (current) use of insulin: Secondary | ICD-10-CM | POA: Diagnosis not present

## 2021-08-16 DIAGNOSIS — E118 Type 2 diabetes mellitus with unspecified complications: Secondary | ICD-10-CM | POA: Diagnosis not present

## 2021-08-16 DIAGNOSIS — Z0000 Encounter for general adult medical examination without abnormal findings: Secondary | ICD-10-CM | POA: Diagnosis not present

## 2021-09-11 DIAGNOSIS — H6502 Acute serous otitis media, left ear: Secondary | ICD-10-CM | POA: Diagnosis not present

## 2021-09-11 DIAGNOSIS — H6982 Other specified disorders of Eustachian tube, left ear: Secondary | ICD-10-CM | POA: Diagnosis not present

## 2021-09-11 DIAGNOSIS — H903 Sensorineural hearing loss, bilateral: Secondary | ICD-10-CM | POA: Diagnosis not present

## 2021-09-11 DIAGNOSIS — J019 Acute sinusitis, unspecified: Secondary | ICD-10-CM | POA: Diagnosis not present

## 2021-10-11 DIAGNOSIS — J310 Chronic rhinitis: Secondary | ICD-10-CM | POA: Diagnosis not present

## 2021-10-11 DIAGNOSIS — H6522 Chronic serous otitis media, left ear: Secondary | ICD-10-CM | POA: Diagnosis not present

## 2021-11-03 IMAGING — MR MR CERVICAL SPINE W/O CM
5 series · 39 of 48 positions shown · non-contrast
Comparison: 09/05/2019 cervical spine radiographs.

CLINICAL DATA: Neck pain

EXAM:
MRI CERVICAL SPINE WITHOUT CONTRAST
TECHNIQUE: Multiplanar, multisequence MR imaging of the cervical spine was
performed. No intravenous contrast was administered.

[Series 5: T2 · sagittal · 3.0mm · 0.62mm/px · 6 of 15 slices shown (1 of 2)]
[im 1/15]
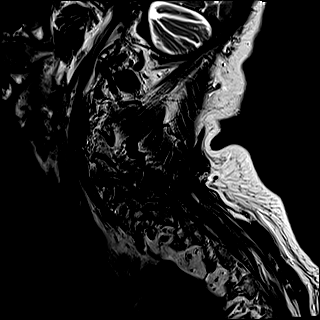
[im 3/15]
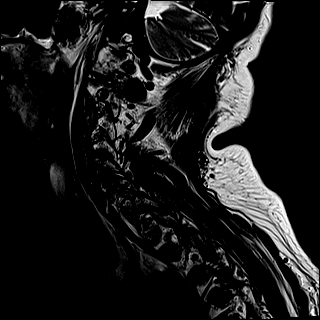
[im 6/15]
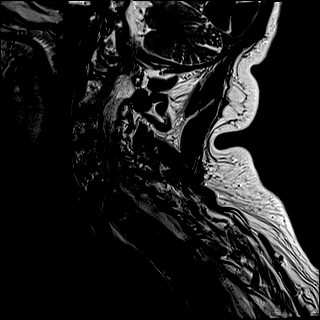
[im 9/15]
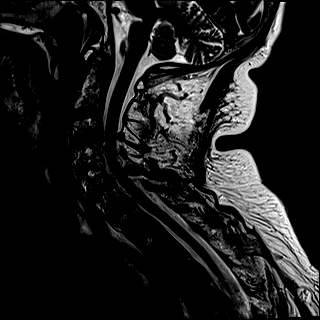
[im 12/15]
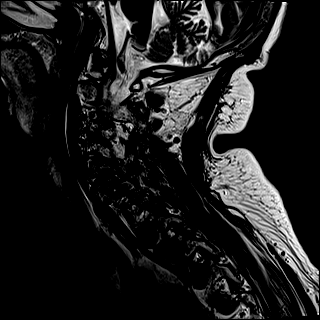
[im 15/15]
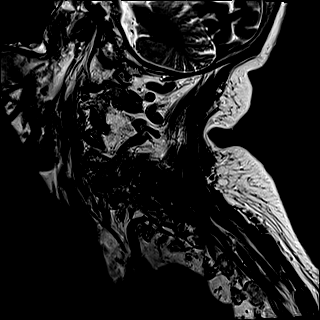

[Series 6: FLAIR · sagittal · 3.0mm · 0.78mm/px · 7 of 15 slices shown]
[im 1/15]
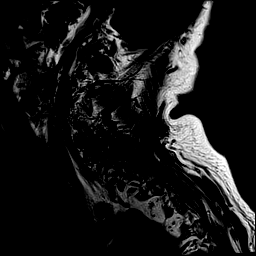
[im 3/15]
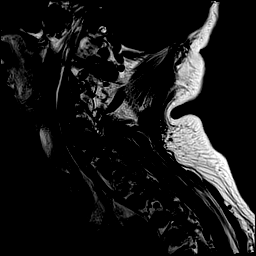
[im 5/15]
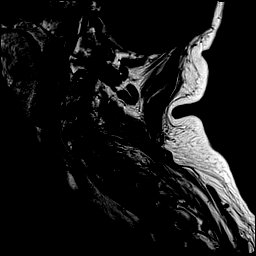
[im 8/15]
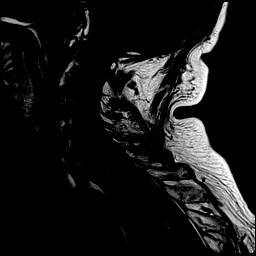
[im 10/15]
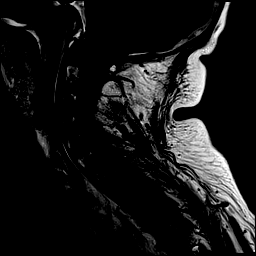
[im 12/15]
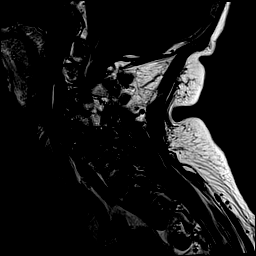
[im 15/15]
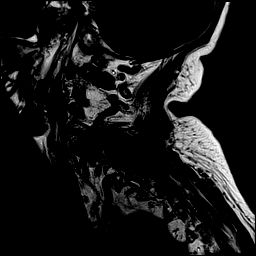

[Series 7: STIR · sagittal · 3.0mm · 0.62mm/px · 7 of 15 slices shown]
[im 1/15]
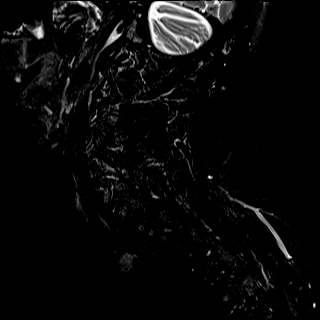
[im 3/15]
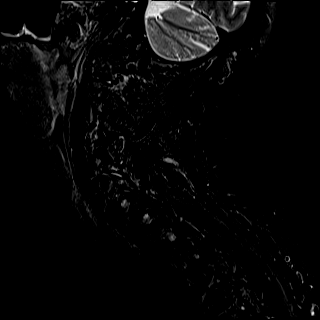
[im 5/15]
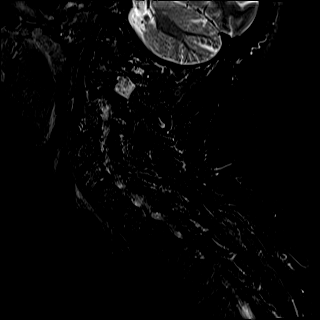
[im 8/15]
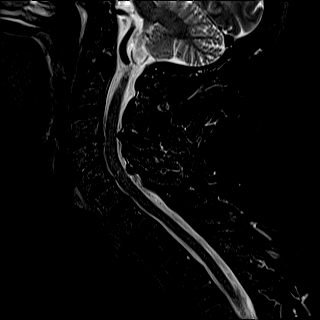
[im 10/15]
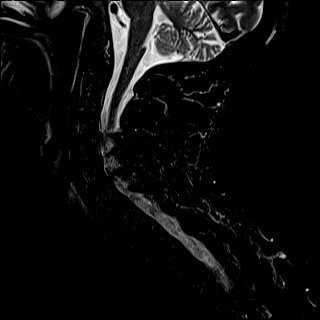
[im 12/15]
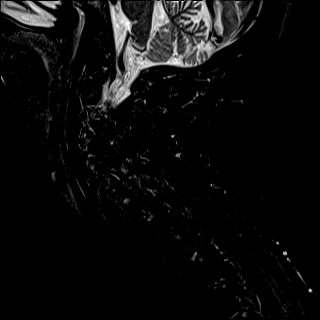
[im 15/15]
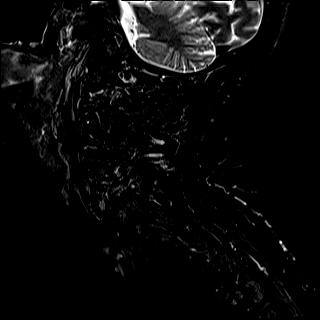

[Series 8: T2 · axial · 3.0mm · 0.70mm/px · z∈[-139,-46]mm · 11 of 29 slices shown (2 of 2)]
[im 1/29]
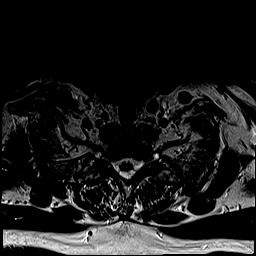
[im 3/29]
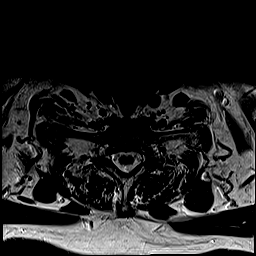
[im 5/29]
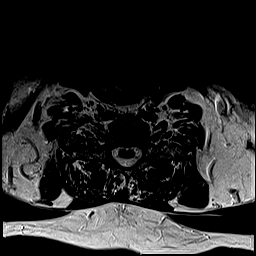
[im 7/29]
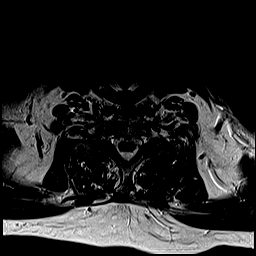
[im 9/29]
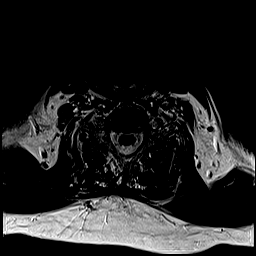
[im 11/29]
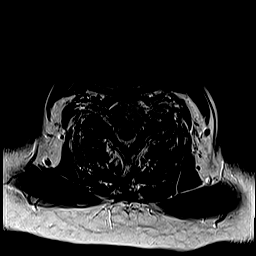
[im 13/29]
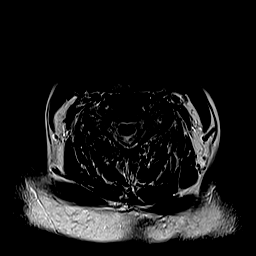
[im 16/29]
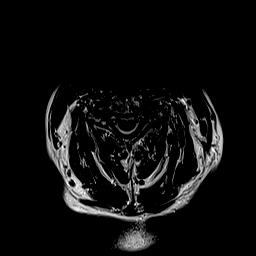
[im 20/29]
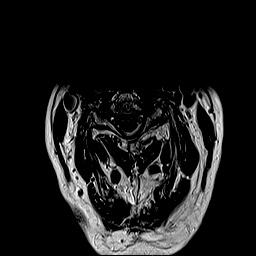
[im 24/29]
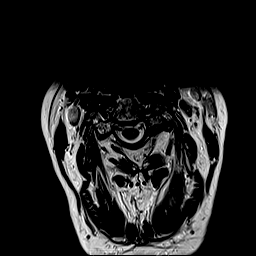
[im 29/29]
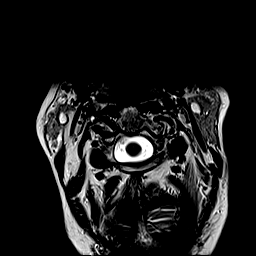

[Series 9: ax mpgr · axial · 3.0mm · 0.35mm/px · z∈[-139,-46]mm · 8 of 29 slices shown]
[im 1/29]
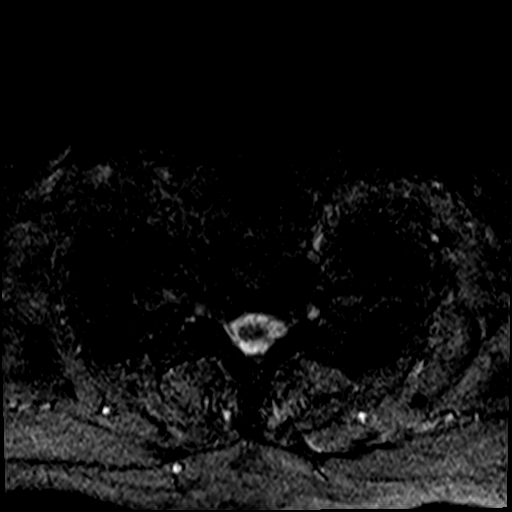
[im 5/29]
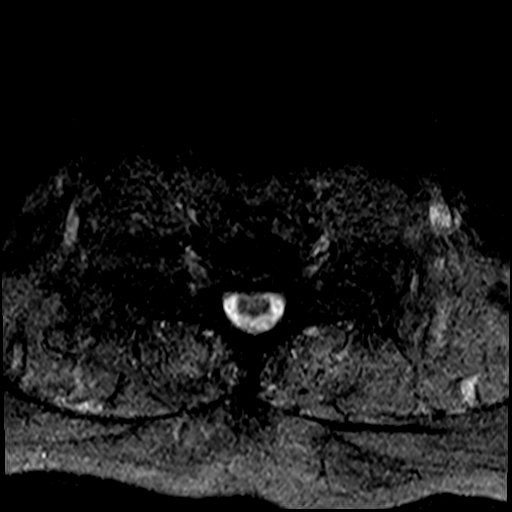
[im 9/29]
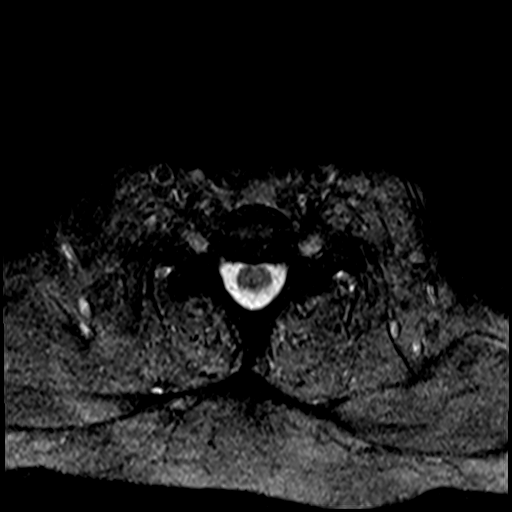
[im 13/29]
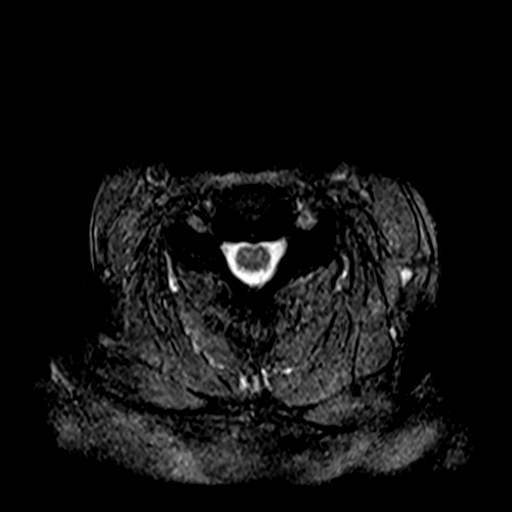
[im 16/29]
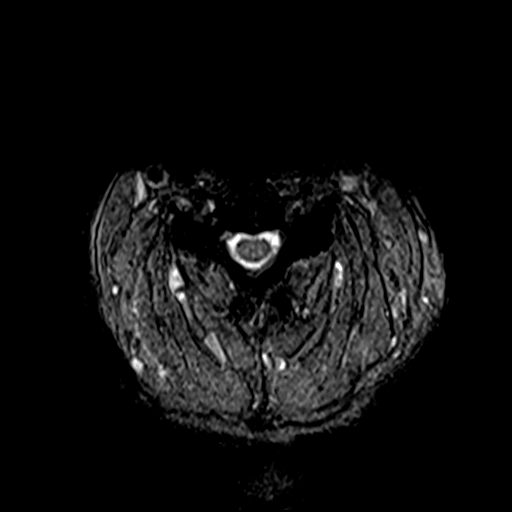
[im 20/29]
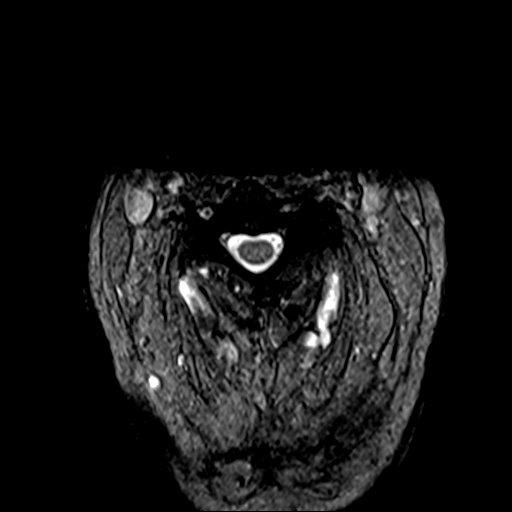
[im 24/29]
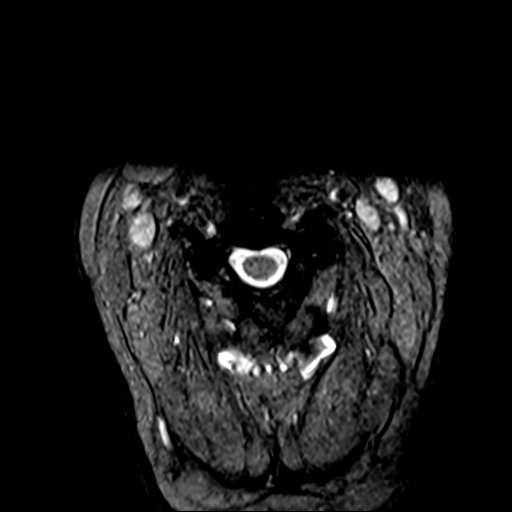
[im 29/29]
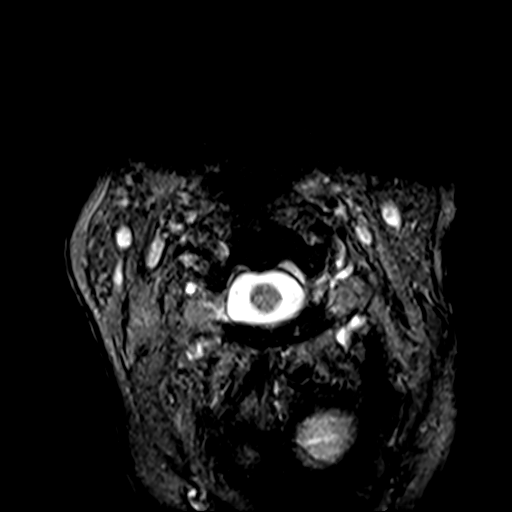

[39 of 48 positions shown; findings below may reference images not displayed]

FINDINGS: Alignment: Minimal grade 1 C5-6 and C6-7 anterolisthesis.
Exaggerated lordosis.

Vertebrae: Normal bone marrow signal intensity. Scattered T1/T2
hyperintense foci may reflect hemangiomata versus focal fat.

Cord: Normal signal and morphology.

Posterior Fossa, vertebral arteries: Negative.

Disc levels: Multilevel desiccation. Mild disc space loss at the
C4-5 and C5-6 levels.

C2-3: Small central protrusion and bilateral facet hypertrophy. No
significant spinal canal or neural foraminal narrowing.

C3-4: Disc osteophyte complex with superimposed central protrusion,
uncovertebral and bilateral facet hypertrophy. Moderate spinal
canal, mild right and moderate left neural foraminal narrowing.

C4-5: Bilateral uncovertebral and facet hypertrophy. Mild spinal
canal and bilateral neural foraminal narrowing.

C5-6: Disc osteophyte complex with small superimposed central
protrusion, uncovertebral and bilateral facet hypertrophy. Moderate
spinal canal and mild bilateral neural foraminal narrowing.

C6-7: Disc osteophyte complex with uncovertebral and bilateral facet
hypertrophy.No significant spinal canal narrowing. Mild bilateral
neural foraminal narrowing.

C7-T1: No significant disc bulge, spinal canal or neural foraminal
narrowing.

Paraspinal tissues: Within normal limits.
IMPRESSION: Multilevel spondylosis with minimal grade 1 C5-6 and C6-7
anterolisthesis.

Moderate C3-4 and C5-6 spinal canal narrowing.

Moderate left C3-4 neural foraminal narrowing.

## 2021-11-05 DIAGNOSIS — M67819 Other specified disorders of synovium and tendon, unspecified shoulder: Secondary | ICD-10-CM | POA: Diagnosis not present

## 2021-11-05 DIAGNOSIS — M47812 Spondylosis without myelopathy or radiculopathy, cervical region: Secondary | ICD-10-CM | POA: Diagnosis not present

## 2021-11-05 DIAGNOSIS — M542 Cervicalgia: Secondary | ICD-10-CM | POA: Diagnosis not present

## 2021-11-05 DIAGNOSIS — M25512 Pain in left shoulder: Secondary | ICD-10-CM | POA: Diagnosis not present

## 2021-11-05 DIAGNOSIS — M7918 Myalgia, other site: Secondary | ICD-10-CM | POA: Diagnosis not present

## 2021-11-08 DIAGNOSIS — H6522 Chronic serous otitis media, left ear: Secondary | ICD-10-CM | POA: Diagnosis not present

## 2021-11-08 DIAGNOSIS — H90A32 Mixed conductive and sensorineural hearing loss, unilateral, left ear with restricted hearing on the contralateral side: Secondary | ICD-10-CM | POA: Diagnosis not present

## 2021-11-13 DIAGNOSIS — E118 Type 2 diabetes mellitus with unspecified complications: Secondary | ICD-10-CM | POA: Diagnosis not present

## 2021-11-13 DIAGNOSIS — I2510 Atherosclerotic heart disease of native coronary artery without angina pectoris: Secondary | ICD-10-CM | POA: Diagnosis not present

## 2021-11-13 DIAGNOSIS — Z794 Long term (current) use of insulin: Secondary | ICD-10-CM | POA: Diagnosis not present

## 2021-11-13 DIAGNOSIS — J449 Chronic obstructive pulmonary disease, unspecified: Secondary | ICD-10-CM | POA: Diagnosis not present

## 2021-12-16 DIAGNOSIS — I2510 Atherosclerotic heart disease of native coronary artery without angina pectoris: Secondary | ICD-10-CM | POA: Diagnosis not present

## 2021-12-16 DIAGNOSIS — J449 Chronic obstructive pulmonary disease, unspecified: Secondary | ICD-10-CM | POA: Diagnosis not present

## 2021-12-16 DIAGNOSIS — Z794 Long term (current) use of insulin: Secondary | ICD-10-CM | POA: Diagnosis not present

## 2021-12-16 DIAGNOSIS — E118 Type 2 diabetes mellitus with unspecified complications: Secondary | ICD-10-CM | POA: Diagnosis not present

## 2021-12-20 DIAGNOSIS — E118 Type 2 diabetes mellitus with unspecified complications: Secondary | ICD-10-CM | POA: Diagnosis not present

## 2021-12-20 DIAGNOSIS — E119 Type 2 diabetes mellitus without complications: Secondary | ICD-10-CM | POA: Diagnosis not present

## 2021-12-20 DIAGNOSIS — J984 Other disorders of lung: Secondary | ICD-10-CM | POA: Diagnosis not present

## 2021-12-20 DIAGNOSIS — J449 Chronic obstructive pulmonary disease, unspecified: Secondary | ICD-10-CM | POA: Diagnosis not present

## 2021-12-20 DIAGNOSIS — Z794 Long term (current) use of insulin: Secondary | ICD-10-CM | POA: Diagnosis not present

## 2021-12-20 DIAGNOSIS — I2510 Atherosclerotic heart disease of native coronary artery without angina pectoris: Secondary | ICD-10-CM | POA: Diagnosis not present

## 2021-12-20 DIAGNOSIS — I10 Essential (primary) hypertension: Secondary | ICD-10-CM | POA: Diagnosis not present

## 2021-12-20 DIAGNOSIS — R0789 Other chest pain: Secondary | ICD-10-CM | POA: Diagnosis not present

## 2022-01-24 DIAGNOSIS — Z794 Long term (current) use of insulin: Secondary | ICD-10-CM | POA: Diagnosis not present

## 2022-01-24 DIAGNOSIS — E118 Type 2 diabetes mellitus with unspecified complications: Secondary | ICD-10-CM | POA: Diagnosis not present

## 2022-01-24 DIAGNOSIS — J449 Chronic obstructive pulmonary disease, unspecified: Secondary | ICD-10-CM | POA: Diagnosis not present

## 2022-01-24 DIAGNOSIS — I2510 Atherosclerotic heart disease of native coronary artery without angina pectoris: Secondary | ICD-10-CM | POA: Diagnosis not present

## 2022-08-27 DIAGNOSIS — M159 Polyosteoarthritis, unspecified: Secondary | ICD-10-CM | POA: Diagnosis not present

## 2022-08-27 DIAGNOSIS — I2510 Atherosclerotic heart disease of native coronary artery without angina pectoris: Secondary | ICD-10-CM | POA: Diagnosis not present

## 2022-08-27 DIAGNOSIS — J4489 Other specified chronic obstructive pulmonary disease: Secondary | ICD-10-CM | POA: Diagnosis not present

## 2022-08-27 DIAGNOSIS — E118 Type 2 diabetes mellitus with unspecified complications: Secondary | ICD-10-CM | POA: Diagnosis not present

## 2022-08-27 DIAGNOSIS — I10 Essential (primary) hypertension: Secondary | ICD-10-CM | POA: Diagnosis not present

## 2022-08-27 DIAGNOSIS — Z794 Long term (current) use of insulin: Secondary | ICD-10-CM | POA: Diagnosis not present

## 2022-10-17 DIAGNOSIS — J310 Chronic rhinitis: Secondary | ICD-10-CM | POA: Diagnosis not present

## 2022-10-17 DIAGNOSIS — H6993 Unspecified Eustachian tube disorder, bilateral: Secondary | ICD-10-CM | POA: Diagnosis not present

## 2022-10-17 DIAGNOSIS — R0982 Postnasal drip: Secondary | ICD-10-CM | POA: Diagnosis not present

## 2023-01-02 DIAGNOSIS — Z794 Long term (current) use of insulin: Secondary | ICD-10-CM | POA: Diagnosis not present

## 2023-01-02 DIAGNOSIS — Z1331 Encounter for screening for depression: Secondary | ICD-10-CM | POA: Diagnosis not present

## 2023-01-02 DIAGNOSIS — E119 Type 2 diabetes mellitus without complications: Secondary | ICD-10-CM | POA: Diagnosis not present

## 2023-01-02 DIAGNOSIS — I2510 Atherosclerotic heart disease of native coronary artery without angina pectoris: Secondary | ICD-10-CM | POA: Diagnosis not present

## 2023-01-02 DIAGNOSIS — I10 Essential (primary) hypertension: Secondary | ICD-10-CM | POA: Diagnosis not present

## 2023-01-02 DIAGNOSIS — Z0000 Encounter for general adult medical examination without abnormal findings: Secondary | ICD-10-CM | POA: Diagnosis not present

## 2023-01-02 DIAGNOSIS — J4489 Other specified chronic obstructive pulmonary disease: Secondary | ICD-10-CM | POA: Diagnosis not present

## 2023-01-27 DIAGNOSIS — J441 Chronic obstructive pulmonary disease with (acute) exacerbation: Secondary | ICD-10-CM | POA: Diagnosis not present

## 2023-01-27 DIAGNOSIS — I700 Atherosclerosis of aorta: Secondary | ICD-10-CM | POA: Diagnosis not present

## 2023-02-11 DIAGNOSIS — E118 Type 2 diabetes mellitus with unspecified complications: Secondary | ICD-10-CM | POA: Diagnosis not present

## 2023-02-11 DIAGNOSIS — I2510 Atherosclerotic heart disease of native coronary artery without angina pectoris: Secondary | ICD-10-CM | POA: Diagnosis not present

## 2023-02-11 DIAGNOSIS — I10 Essential (primary) hypertension: Secondary | ICD-10-CM | POA: Diagnosis not present

## 2023-02-11 DIAGNOSIS — J441 Chronic obstructive pulmonary disease with (acute) exacerbation: Secondary | ICD-10-CM | POA: Diagnosis not present

## 2023-02-20 DIAGNOSIS — H2513 Age-related nuclear cataract, bilateral: Secondary | ICD-10-CM | POA: Diagnosis not present

## 2023-02-20 DIAGNOSIS — H524 Presbyopia: Secondary | ICD-10-CM | POA: Diagnosis not present

## 2023-03-06 DIAGNOSIS — H25813 Combined forms of age-related cataract, bilateral: Secondary | ICD-10-CM | POA: Diagnosis not present

## 2023-03-06 DIAGNOSIS — Z01818 Encounter for other preprocedural examination: Secondary | ICD-10-CM | POA: Diagnosis not present

## 2023-03-06 DIAGNOSIS — H25812 Combined forms of age-related cataract, left eye: Secondary | ICD-10-CM | POA: Diagnosis not present

## 2023-03-16 DIAGNOSIS — J449 Chronic obstructive pulmonary disease, unspecified: Secondary | ICD-10-CM | POA: Diagnosis not present

## 2023-03-16 DIAGNOSIS — H25812 Combined forms of age-related cataract, left eye: Secondary | ICD-10-CM | POA: Diagnosis not present

## 2023-03-16 DIAGNOSIS — E1136 Type 2 diabetes mellitus with diabetic cataract: Secondary | ICD-10-CM | POA: Diagnosis not present

## 2023-06-15 DIAGNOSIS — M25531 Pain in right wrist: Secondary | ICD-10-CM | POA: Diagnosis not present

## 2023-06-15 DIAGNOSIS — S62316A Displaced fracture of base of fifth metacarpal bone, right hand, initial encounter for closed fracture: Secondary | ICD-10-CM | POA: Diagnosis not present

## 2023-06-17 DIAGNOSIS — M1990 Unspecified osteoarthritis, unspecified site: Secondary | ICD-10-CM | POA: Diagnosis not present

## 2023-06-17 DIAGNOSIS — Z7902 Long term (current) use of antithrombotics/antiplatelets: Secondary | ICD-10-CM | POA: Diagnosis not present

## 2023-06-17 DIAGNOSIS — I700 Atherosclerosis of aorta: Secondary | ICD-10-CM | POA: Diagnosis not present

## 2023-06-17 DIAGNOSIS — E785 Hyperlipidemia, unspecified: Secondary | ICD-10-CM | POA: Diagnosis not present

## 2023-06-17 DIAGNOSIS — E1143 Type 2 diabetes mellitus with diabetic autonomic (poly)neuropathy: Secondary | ICD-10-CM | POA: Diagnosis not present

## 2023-06-17 DIAGNOSIS — Z794 Long term (current) use of insulin: Secondary | ICD-10-CM | POA: Diagnosis not present

## 2023-06-17 DIAGNOSIS — I129 Hypertensive chronic kidney disease with stage 1 through stage 4 chronic kidney disease, or unspecified chronic kidney disease: Secondary | ICD-10-CM | POA: Diagnosis not present

## 2023-06-17 DIAGNOSIS — N529 Male erectile dysfunction, unspecified: Secondary | ICD-10-CM | POA: Diagnosis not present

## 2023-06-17 DIAGNOSIS — R0982 Postnasal drip: Secondary | ICD-10-CM | POA: Diagnosis not present

## 2023-06-17 DIAGNOSIS — N189 Chronic kidney disease, unspecified: Secondary | ICD-10-CM | POA: Diagnosis not present

## 2023-06-17 DIAGNOSIS — Z7984 Long term (current) use of oral hypoglycemic drugs: Secondary | ICD-10-CM | POA: Diagnosis not present

## 2023-06-17 DIAGNOSIS — E1165 Type 2 diabetes mellitus with hyperglycemia: Secondary | ICD-10-CM | POA: Diagnosis not present

## 2023-06-17 DIAGNOSIS — Z8249 Family history of ischemic heart disease and other diseases of the circulatory system: Secondary | ICD-10-CM | POA: Diagnosis not present

## 2023-06-17 DIAGNOSIS — J45909 Unspecified asthma, uncomplicated: Secondary | ICD-10-CM | POA: Diagnosis not present

## 2023-06-17 DIAGNOSIS — E1142 Type 2 diabetes mellitus with diabetic polyneuropathy: Secondary | ICD-10-CM | POA: Diagnosis not present

## 2023-06-17 DIAGNOSIS — I252 Old myocardial infarction: Secondary | ICD-10-CM | POA: Diagnosis not present

## 2023-06-17 DIAGNOSIS — I25119 Atherosclerotic heart disease of native coronary artery with unspecified angina pectoris: Secondary | ICD-10-CM | POA: Diagnosis not present

## 2023-06-17 DIAGNOSIS — Z823 Family history of stroke: Secondary | ICD-10-CM | POA: Diagnosis not present

## 2023-06-18 ENCOUNTER — Other Ambulatory Visit: Payer: Self-pay | Admitting: Surgery

## 2023-06-18 DIAGNOSIS — S62316A Displaced fracture of base of fifth metacarpal bone, right hand, initial encounter for closed fracture: Secondary | ICD-10-CM

## 2023-06-22 ENCOUNTER — Encounter: Payer: Self-pay | Admitting: Surgery

## 2023-06-23 ENCOUNTER — Ambulatory Visit
Admission: RE | Admit: 2023-06-23 | Discharge: 2023-06-23 | Disposition: A | Discharge status: Home / Self Care | Source: Ambulatory Visit | Attending: Surgery | Admitting: Surgery

## 2023-06-23 DIAGNOSIS — S62316D Displaced fracture of base of fifth metacarpal bone, right hand, subsequent encounter for fracture with routine healing: Secondary | ICD-10-CM | POA: Diagnosis not present

## 2023-06-23 DIAGNOSIS — M19041 Primary osteoarthritis, right hand: Secondary | ICD-10-CM | POA: Diagnosis not present

## 2023-06-23 DIAGNOSIS — M25531 Pain in right wrist: Secondary | ICD-10-CM | POA: Diagnosis not present

## 2023-06-23 DIAGNOSIS — S62316A Displaced fracture of base of fifth metacarpal bone, right hand, initial encounter for closed fracture: Secondary | ICD-10-CM

## 2023-07-17 DIAGNOSIS — M542 Cervicalgia: Secondary | ICD-10-CM | POA: Diagnosis not present

## 2023-07-17 DIAGNOSIS — S62316D Displaced fracture of base of fifth metacarpal bone, right hand, subsequent encounter for fracture with routine healing: Secondary | ICD-10-CM | POA: Diagnosis not present

## 2023-07-17 DIAGNOSIS — M25512 Pain in left shoulder: Secondary | ICD-10-CM | POA: Diagnosis not present

## 2023-09-08 DIAGNOSIS — I10 Essential (primary) hypertension: Secondary | ICD-10-CM | POA: Diagnosis not present

## 2023-09-08 DIAGNOSIS — J4489 Other specified chronic obstructive pulmonary disease: Secondary | ICD-10-CM | POA: Diagnosis not present

## 2023-09-08 DIAGNOSIS — Z794 Long term (current) use of insulin: Secondary | ICD-10-CM | POA: Diagnosis not present

## 2023-09-08 DIAGNOSIS — E119 Type 2 diabetes mellitus without complications: Secondary | ICD-10-CM | POA: Diagnosis not present

## 2023-09-08 DIAGNOSIS — Z1331 Encounter for screening for depression: Secondary | ICD-10-CM | POA: Diagnosis not present

## 2023-09-08 DIAGNOSIS — I2510 Atherosclerotic heart disease of native coronary artery without angina pectoris: Secondary | ICD-10-CM | POA: Diagnosis not present

## 2023-12-10 DIAGNOSIS — M47812 Spondylosis without myelopathy or radiculopathy, cervical region: Secondary | ICD-10-CM | POA: Diagnosis not present

## 2023-12-10 DIAGNOSIS — M25512 Pain in left shoulder: Secondary | ICD-10-CM | POA: Diagnosis not present

## 2023-12-10 DIAGNOSIS — M7552 Bursitis of left shoulder: Secondary | ICD-10-CM | POA: Diagnosis not present

## 2023-12-10 DIAGNOSIS — M542 Cervicalgia: Secondary | ICD-10-CM | POA: Diagnosis not present
